# Patient Record
Sex: Female | Born: 2005 | Race: Black or African American | Hispanic: No | Marital: Single | Smoking: Never smoker
Health system: Southern US, Community
[De-identification: ages and names within clinical notes are randomized; demographics above are authoritative.]

## PROBLEM LIST (undated history)

## (undated) ENCOUNTER — Inpatient Hospital Stay (HOSPITAL_COMMUNITY): Payer: Self-pay

## (undated) DIAGNOSIS — Z789 Other specified health status: Secondary | ICD-10-CM

## (undated) HISTORY — DX: Other specified health status: Z78.9

## (undated) HISTORY — PX: WISDOM TOOTH EXTRACTION: SHX21

---
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
## 2024-04-02 ENCOUNTER — Inpatient Hospital Stay (HOSPITAL_COMMUNITY)

## 2024-04-02 ENCOUNTER — Inpatient Hospital Stay (HOSPITAL_COMMUNITY)
Admission: AD | Admit: 2024-04-02 | Discharge: 2024-04-02 | Disposition: A | Discharge status: Home / Self Care | Attending: Obstetrics and Gynecology | Admitting: Obstetrics and Gynecology

## 2024-04-02 ENCOUNTER — Encounter (HOSPITAL_COMMUNITY): Payer: Self-pay | Admitting: Obstetrics and Gynecology

## 2024-04-02 DIAGNOSIS — O26891 Other specified pregnancy related conditions, first trimester: Secondary | ICD-10-CM | POA: Diagnosis present

## 2024-04-02 DIAGNOSIS — Z3201 Encounter for pregnancy test, result positive: Secondary | ICD-10-CM | POA: Diagnosis not present

## 2024-04-02 DIAGNOSIS — Z3A01 Less than 8 weeks gestation of pregnancy: Secondary | ICD-10-CM | POA: Diagnosis not present

## 2024-04-02 DIAGNOSIS — Z3490 Encounter for supervision of normal pregnancy, unspecified, unspecified trimester: Secondary | ICD-10-CM

## 2024-04-02 DIAGNOSIS — O210 Mild hyperemesis gravidarum: Secondary | ICD-10-CM | POA: Diagnosis not present

## 2024-04-02 DIAGNOSIS — R109 Unspecified abdominal pain: Secondary | ICD-10-CM | POA: Insufficient documentation

## 2024-04-02 LAB — URINALYSIS, ROUTINE W REFLEX MICROSCOPIC
Bilirubin Urine: NEGATIVE
Glucose, UA: NEGATIVE mg/dL
Hgb urine dipstick: NEGATIVE
Ketones, ur: NEGATIVE mg/dL
Leukocytes,Ua: NEGATIVE
Nitrite: NEGATIVE
Protein, ur: NEGATIVE mg/dL
Specific Gravity, Urine: 1.002 — ABNORMAL LOW (ref 1.005–1.030)
pH: 6 (ref 5.0–8.0)

## 2024-04-02 LAB — CBC
HCT: 37.5 % (ref 36.0–46.0)
Hemoglobin: 13.2 g/dL (ref 12.0–15.0)
MCH: 30.9 pg (ref 26.0–34.0)
MCHC: 35.2 g/dL (ref 30.0–36.0)
MCV: 87.8 fL (ref 80.0–100.0)
Platelets: 258 K/uL (ref 150–400)
RBC: 4.27 MIL/uL (ref 3.87–5.11)
RDW: 11.6 % (ref 11.5–15.5)
WBC: 8.1 K/uL (ref 4.0–10.5)
nRBC: 0 % (ref 0.0–0.2)

## 2024-04-02 LAB — WET PREP, GENITAL
Clue Cells Wet Prep HPF POC: NONE SEEN
Sperm: NONE SEEN
Trich, Wet Prep: NONE SEEN
WBC, Wet Prep HPF POC: <10 (ref <10)
Yeast Wet Prep HPF POC: NONE SEEN

## 2024-04-02 LAB — HCG, QUANTITATIVE, PREGNANCY: hCG, Beta Chain, Quant, S: 44592 m[IU]/mL — ABNORMAL HIGH (ref <5)

## 2024-04-02 LAB — ABO/RH: ABO/RH(D): B POS

## 2024-04-02 LAB — HIV ANTIBODY (ROUTINE TESTING W REFLEX): HIV Screen 4th Generation wRfx: NONREACTIVE

## 2024-04-02 LAB — POCT PREGNANCY, URINE: Preg Test, Ur: POSITIVE — AB

## 2024-04-02 MED ORDER — PROCHLORPERAZINE MALEATE 10 MG PO TABS
10.0000 mg | ORAL_TABLET | Freq: Four times a day (QID) | ORAL | 0 refills | Status: DC | PRN
Start: 2024-04-02 — End: 2024-05-12

## 2024-04-02 MED ORDER — PREPLUS 27-1 MG PO TABS
1.0000 | ORAL_TABLET | Freq: Every day | ORAL | 13 refills | Status: DC
Start: 2024-04-02 — End: 2024-08-04

## 2024-04-02 NOTE — MAU Provider Note (Signed)
 History     CSN: [id number]  Arrival date and time: 04/02/24 1830   Event Date/Time   First Provider Initiated Contact with Patient 04/02/24 2154      Chief Complaint  Patient presents with   Abdominal Pain   Vaginal Discharge   Possible Pregnancy   HPI [name] is a 18 y.o. year old G41P0000 female at [redacted]w[redacted]d weeks gestation who presents to MAU reporting (+) HPT after being late on her period, feeling tired and nauseated. She also reports a sticky, creamy, yellow vaginal d/c and abdominal cramping like a period. She denies VB. She was not on BC.    OB History     Gravida  1   Para  0   Term  0   Preterm  0   AB  0   Living  0      SAB  0   IAB  0   Ectopic  0   Multiple  0   Live Births  0           History reviewed. No pertinent past medical history.  Past Surgical History:  Procedure Laterality Date   WISDOM TOOTH EXTRACTION      Family History  Problem Relation Age of Onset   Healthy Mother    AAA (abdominal aortic aneurysm) Father    Diabetes Paternal Grandmother    Anemia Paternal Grandfather     Social History   Tobacco Use   Smoking status: Never   Smokeless tobacco: Never  Vaping Use   Vaping status: Never Used  Substance Use Topics   Alcohol use: Never   Drug use: Never    Allergies: No Known Allergies  Medications Prior to Admission  Medication Sig Dispense Refill Last Dose/Taking   ondansetron  (ZOFRAN -ODT) 4 MG disintegrating tablet Take 1 tablet (4 mg total) by mouth every 8 (eight) hours as needed for nausea or vomiting. 20 tablet 0     Review of Systems  Constitutional: Negative.   HENT: Negative.    Eyes: Negative.   Respiratory: Negative.    Cardiovascular: Negative.   Gastrointestinal: Negative.   Endocrine: Negative.   Genitourinary:  Positive for pelvic pain (cramping) and vaginal discharge (sticky, creamy, yellow d/c).  Skin: Negative.   Allergic/Immunologic: Negative.   Neurological: Negative.    Hematological: Negative.   Psychiatric/Behavioral: Negative.     Physical Exam   Blood pressure (!) 124/90, pulse 94, temperature 98.7 F (37.1 C), temperature source Oral, resp. rate 20, height 5' 7 (1.702 m), weight 63 kg, last menstrual period 02/15/2024, SpO2 100%.  Physical Exam Vitals and nursing note reviewed.  Constitutional:      Appearance: Normal appearance. She is normal weight.  Cardiovascular:     Rate and Rhythm: Normal rate.  Pulmonary:     Effort: Pulmonary effort is normal.  Genitourinary:    Comments: Swabs collected by patient using blind swab technique  Musculoskeletal:        General: Normal range of motion.  Skin:    General: Skin is warm and dry.  Neurological:     Mental Status: She is alert and oriented to person, place, and time.  Psychiatric:        Mood and Affect: Mood normal.        Behavior: Behavior normal.        Thought Content: Thought content normal.        Judgment: Judgment normal.    MAU Course  Procedures  MDM  [facility] UPT CBC ABO/Rh HCG Wet Prep GC/CT -- Results pending  RPR -- Results pending  OB U/S < 14 wks TVUS  Results for orders placed or performed during the hospital encounter of 04/02/24 (from the past 24 hours)  Pregnancy, urine POC     Status: Abnormal   Collection Time: 04/02/24  8:21 PM  Result Value Ref Range   Preg Test, Ur POSITIVE (A) NEGATIVE  Wet prep, genital     Status: None   Collection Time: 04/02/24  8:23 PM  Result Value Ref Range   Yeast Wet Prep HPF POC NONE SEEN NONE SEEN   Trich, Wet Prep NONE SEEN NONE SEEN   Clue Cells Wet Prep HPF POC NONE SEEN NONE SEEN   WBC, Wet Prep HPF POC <10 <10   Sperm NONE SEEN   Urinalysis, Routine w reflex microscopic -Urine, Clean Catch     Status: Abnormal   Collection Time: 04/02/24  8:24 PM  Result Value Ref Range   Color, Urine STRAW (A) YELLOW   APPearance HAZY (A) CLEAR   Specific Gravity, Urine 1.002 (L) 1.005 - 1.030   pH 6.0 5.0 - 8.0    Glucose, UA NEGATIVE NEGATIVE mg/dL   Hgb urine dipstick NEGATIVE NEGATIVE   Bilirubin Urine NEGATIVE NEGATIVE   Ketones, ur NEGATIVE NEGATIVE mg/dL   Protein, ur NEGATIVE NEGATIVE mg/dL   Nitrite NEGATIVE NEGATIVE   Leukocytes,Ua NEGATIVE NEGATIVE  ABO/Rh     Status: None   Collection Time: 04/02/24  8:52 PM  Result Value Ref Range   ABO/RH(D) B POS    No rh immune globuloin      NOT A RH IMMUNE GLOBULIN CANDIDATE, PT RH POSITIVE Performed at [facility], [address] [place], [place] [address]   CBC     Status: None   Collection Time: 04/02/24  8:58 PM  Result Value Ref Range   WBC 8.1 4.0 - 10.5 K/uL   RBC 4.27 3.87 - 5.11 MIL/uL   Hemoglobin 13.2 12.0 - 15.0 g/dL   HCT 62.4 63.9 - 53.9 %   MCV 87.8 80.0 - 100.0 fL   MCH 30.9 26.0 - 34.0 pg   MCHC 35.2 30.0 - 36.0 g/dL   RDW 88.3 88.4 - 84.4 %   Platelets 258 150 - 400 K/uL   nRBC 0.0 0.0 - 0.2 %    US  OB LESS THAN 14 WEEKS WITH OB TRANSVAGINAL Result Date: 04/02/2024 CLINICAL DATA:  Abdominal pain during first trimester pregnancy. Estimated gestational age by LMP is 6 weeks 5 days. Positive urine pregnancy test. Quantitative beta HCG is pending. EXAM: OBSTETRIC <14 WK US  AND TRANSVAGINAL OB US  TECHNIQUE: Transvaginal ultrasound was performed for complete evaluation of the gestation as well as the maternal uterus, adnexal regions, and pelvic cul-de-sac. COMPARISON:  None Available. FINDINGS: Intrauterine gestational sac: A single intrauterine gestational sac is present. Yolk sac:  Yolk sac is present. Embryo:  Fetal pole is present. Cardiac Activity: Fetal cardiac activity is observed. Heart Rate: 133 bpm CRL:   6.9 mm   6 w 4 d                  US  EDC: 11/22/2024 Subchorionic hemorrhage:  None visualized. Maternal uterus/adnexae: Uterus appears retroverted. No myometrial mass lesions identified. Both ovaries are visualized and appear normal. Trace free fluid. IMPRESSION: Single intrauterine pregnancy. Estimated gestational  age by crown-rump length is 6 weeks 4 days. No acute complication demonstrated sonographically. Electronically Signed  By: Elsie Gravely M.D.   On: 04/02/2024 21:42     Assessment and Plan  1. Intrauterine pregnancy (Primary) - Information provided on [place] [facility] Providers list - Advised to start [facility] as soon as she could get scheduled   2. Morning sickness - Information provided on morning sickness  - Prescription for: Compazine  10 mg po every 6 hrs prn N/V  3. [redacted] weeks gestation of pregnancy - Information provided on safe medications in pregnancy list     - Discharge home - Call to schedule [facility] - Patient verbalized an understanding of the plan of care and agrees.    Ala Cart, CNM 04/02/2024, 9:54 PM

## 2024-04-02 NOTE — MAU Note (Signed)
 MAU Triage Note:  .Karen Koch is a 18 y.o. at Unknown here in MAU reporting: had a +HPT today after being late on her period and feeling tired/nauseous. Denies VB. Does report sticky/creamy yellow discharge. Also reporting cramping that feels like period cramps. Was not on any BC.  Patient complaint: preg confirm  Pain Score: 1  Pain Location: Abdomen     Onset of complaint: today LMP: Patient's last menstrual period was 02/15/2024.  Vitals:   04/02/24 2014  BP: (!) 124/90  Pulse: 94  Resp: 20  Temp: 98.7 F (37.1 C)  SpO2: 100%    Lab orders placed from triage: orders in prior to triage

## 2024-04-02 NOTE — Discharge Instructions (Addendum)

## 2024-04-03 LAB — RPR: RPR Ser Ql: NONREACTIVE

## 2024-04-03 LAB — GC/CHLAMYDIA PROBE AMP (~~LOC~~) NOT AT ARMC
Chlamydia: NEGATIVE
Comment: NEGATIVE
Comment: NORMAL
Neisseria Gonorrhea: NEGATIVE

## 2024-04-14 ENCOUNTER — Ambulatory Visit: Payer: Self-pay | Admitting: *Deleted

## 2024-04-14 ENCOUNTER — Other Ambulatory Visit (INDEPENDENT_AMBULATORY_CARE_PROVIDER_SITE_OTHER): Payer: Self-pay

## 2024-04-14 VITALS — BP 114/70 | HR 79 | Wt 138.8 lb

## 2024-04-14 DIAGNOSIS — Z3401 Encounter for supervision of normal first pregnancy, first trimester: Secondary | ICD-10-CM | POA: Diagnosis not present

## 2024-04-14 DIAGNOSIS — O219 Vomiting of pregnancy, unspecified: Secondary | ICD-10-CM

## 2024-04-14 DIAGNOSIS — Z362 Encounter for other antenatal screening follow-up: Secondary | ICD-10-CM

## 2024-04-14 DIAGNOSIS — Z3A01 Less than 8 weeks gestation of pregnancy: Secondary | ICD-10-CM

## 2024-04-14 DIAGNOSIS — Z3480 Encounter for supervision of other normal pregnancy, unspecified trimester: Secondary | ICD-10-CM

## 2024-04-14 DIAGNOSIS — Z3A08 8 weeks gestation of pregnancy: Secondary | ICD-10-CM

## 2024-04-14 DIAGNOSIS — Z1331 Encounter for screening for depression: Secondary | ICD-10-CM | POA: Diagnosis not present

## 2024-04-14 MED ORDER — DOXYLAMINE-PYRIDOXINE 10-10 MG PO TBEC: 2.0000 | DELAYED_RELEASE_TABLET | Freq: Every day | ORAL | 5 refills | Status: DC

## 2024-04-14 MED ORDER — VITAFOL GUMMIES 3.33-0.333-34.8 MG PO CHEW: 3.0000 | CHEWABLE_TABLET | Freq: Every day | ORAL | 11 refills | Status: DC

## 2024-04-14 MED ORDER — VITAFOL GUMMIES 3.33-0.333-34.8 MG PO CHEW
1.0000 | CHEWABLE_TABLET | Freq: Every day | ORAL | 5 refills | Status: DC
Start: 2024-04-14 — End: 2024-04-14

## 2024-04-14 NOTE — Progress Notes (Signed)
 New OB Intake  I connected with Karen Koch  on 04/14/24 at  3:10 PM EDT by In Person Visit and verified that I am speaking with the correct person using two identifiers. Nurse is located at [facility] and pt is located at [place].  I discussed the limitations, risks, security and privacy concerns of performing an evaluation and management service by telephone and the availability of in person appointments. I also discussed with the patient that there may be a patient responsible charge related to this service. The patient expressed understanding and agreed to proceed.  I explained I am completing New OB Intake today. We discussed EDD of 11/21/24 based on LMP of 02/15/24. Pt is G1P0000. I reviewed her allergies, medications and Medical/Surgical/OB history.    Patient Active Problem List   Diagnosis Date Noted   Supervision of other normal pregnancy, antepartum 04/14/2024   Birth control counseling 12/07/2020   Encounter for BCP (birth control pills) initial prescription 12/07/2020   Dysmenorrhea in adolescent 12/07/2020     Concerns addressed today  Delivery Plans Plans to deliver at [facility]. Discussed the nature of our practice with multiple providers including residents and students. Due to the size of the practice, the delivering provider may not be the same as those providing prenatal care.   Patient is not interested in water birth.  MyChart/Babyscripts MyChart access verified. I explained pt will have some visits in office and some virtually. Babyscripts instructions given and order placed. Patient verifies receipt of registration text/e-mail. Account successfully created and app downloaded. If patient is a candidate for Optimized scheduling, add to sticky note.   Blood Pressure Cuff/Weight Scale Pt has BP cuff at home. Explained after first prenatal appt pt will check weekly and document in Babyscripts. Patient does not have weight scale; patient may purchase if they desire to track  weight weekly in Babyscripts.  Anatomy US  Explained first scheduled US  will be around 19 weeks. Anatomy US  scheduled for TBD at TBD.  Is patient a candidate for Babyscripts Optimization? No, due to Teen   First visit review I reviewed new OB appt with patient. Explained pt will be seen by Karen Koch,CNM at first visit. Discussed Karen Koch genetic screening with patient. Requests [employer] and [employer]. Routine prenatal labs OB Urine only collected at today's visit. Initial OB labs deferred to New OB appt.   Last Pap No results found for: DIAGPAP  Karen CHRISTELLA Ober, RN 04/14/2024  3:52 PM

## 2024-04-14 NOTE — Patient Instructions (Signed)

## 2024-04-16 ENCOUNTER — Other Ambulatory Visit: Payer: Self-pay

## 2024-04-16 MED ORDER — PRENATAL 28-0.8 MG PO TABS: 1.0000 | ORAL_TABLET | Freq: Every day | ORAL | 12 refills | Status: DC

## 2024-04-20 LAB — URINE CULTURE, OB REFLEX

## 2024-04-20 LAB — CULTURE, OB URINE

## 2024-04-22 ENCOUNTER — Ambulatory Visit: Payer: Self-pay | Admitting: Obstetrics and Gynecology

## 2024-04-22 MED ORDER — CEPHALEXIN 500 MG PO CAPS: 500.0000 mg | ORAL_CAPSULE | Freq: Four times a day (QID) | ORAL | 2 refills | Status: DC

## 2024-04-30 ENCOUNTER — Encounter: Payer: Self-pay | Admitting: Advanced Practice Midwife

## 2024-05-02 ENCOUNTER — Other Ambulatory Visit: Payer: Self-pay

## 2024-05-02 MED ORDER — GLYCOPYRROLATE 2 MG PO TABS: 2.0000 mg | ORAL_TABLET | Freq: Two times a day (BID) | ORAL | 1 refills | Status: DC | PRN

## 2024-05-12 ENCOUNTER — Encounter: Payer: Self-pay | Admitting: Advanced Practice Midwife

## 2024-05-12 ENCOUNTER — Ambulatory Visit (INDEPENDENT_AMBULATORY_CARE_PROVIDER_SITE_OTHER): Payer: Self-pay | Admitting: Advanced Practice Midwife

## 2024-05-12 VITALS — BP 112/71 | HR 90 | Wt 134.8 lb

## 2024-05-12 DIAGNOSIS — Z1331 Encounter for screening for depression: Secondary | ICD-10-CM

## 2024-05-12 DIAGNOSIS — O99611 Diseases of the digestive system complicating pregnancy, first trimester: Secondary | ICD-10-CM | POA: Diagnosis not present

## 2024-05-12 DIAGNOSIS — Z3A12 12 weeks gestation of pregnancy: Secondary | ICD-10-CM

## 2024-05-12 DIAGNOSIS — Z3480 Encounter for supervision of other normal pregnancy, unspecified trimester: Secondary | ICD-10-CM

## 2024-05-12 DIAGNOSIS — O219 Vomiting of pregnancy, unspecified: Secondary | ICD-10-CM | POA: Diagnosis not present

## 2024-05-12 DIAGNOSIS — N390 Urinary tract infection, site not specified: Secondary | ICD-10-CM

## 2024-05-12 DIAGNOSIS — O99619 Diseases of the digestive system complicating pregnancy, unspecified trimester: Secondary | ICD-10-CM | POA: Insufficient documentation

## 2024-05-12 DIAGNOSIS — K117 Disturbances of salivary secretion: Secondary | ICD-10-CM

## 2024-05-12 DIAGNOSIS — B9689 Other specified bacterial agents as the cause of diseases classified elsewhere: Secondary | ICD-10-CM

## 2024-05-12 MED ORDER — CEFADROXIL 500 MG PO CAPS: 500.0000 mg | ORAL_CAPSULE | Freq: Two times a day (BID) | ORAL | 0 refills | Status: DC

## 2024-05-12 MED ORDER — GLYCOPYRROLATE 2 MG PO TABS
2.0000 mg | ORAL_TABLET | Freq: Three times a day (TID) | ORAL | 1 refills | Status: DC | PRN
Start: 2024-05-12 — End: 2024-08-04

## 2024-05-12 MED ORDER — ASPIRIN 81 MG PO TBEC: 81.0000 mg | DELAYED_RELEASE_TABLET | Freq: Every day | ORAL | 6 refills | Status: DC

## 2024-05-12 MED ORDER — ONDANSETRON 4 MG PO TBDP
4.0000 mg | ORAL_TABLET | Freq: Three times a day (TID) | ORAL | 0 refills | Status: DC | PRN
Start: 2024-05-12 — End: 2024-07-07

## 2024-05-12 NOTE — Progress Notes (Signed)
 Pt states she had brownish discharge a month ago that lasted about a week.   Pt scored 10 on PHQ and 8 on GAD. Referral made, pt aware.

## 2024-05-12 NOTE — Progress Notes (Addendum)
 Subjective:   Karen Koch is a 18 y.o. G1P0000 at [redacted]w[redacted]d by LMP being seen today for her first obstetrical visit. She has no previous pregnancies and has Supervision of other normal pregnancy, antepartum and UTI due to Klebsiella species on their problem list. Patient does intend to breast feed.   Patient reports daily nausea and vomiting with excessive saliva production. Has been able to hydrate and eat small amounts of bland food. N/V is interfering with ability to do her job.  HISTORY: OB History  Gravida Para Term Preterm AB Living  1 0 0 0 0 0  SAB IAB Ectopic Multiple Live Births  0 0 0 0 0    # Outcome Date GA Lbr Len/2nd Weight Sex Type Anes PTL Lv  1 Current            Past Medical History:  Diagnosis Date   Dysmenorrhea in adolescent 12/07/2020   Medical history non-contributory    Past Surgical History:  Procedure Laterality Date   WISDOM TOOTH EXTRACTION     Family History  Problem Relation Age of Onset   Seizures Mother    AAA (abdominal aortic aneurysm) Father    Diabetes Paternal Grandmother    Anemia Paternal Grandfather    Social History   Tobacco Use   Smoking status: Never   Smokeless tobacco: Never  Vaping Use   Vaping status: Never Used  Substance Use Topics   Alcohol use: Never   Drug use: Never   No Known Allergies Current Outpatient Medications on File Prior to Visit  Medication Sig Dispense Refill   cephALEXin  (KEFLEX ) 500 MG capsule Take 1 capsule (500 mg total) by mouth 4 (four) times daily. 28 capsule 2   Doxylamine -Pyridoxine  (DICLEGIS ) 10-10 MG TBEC Take 2 tablets by mouth at bedtime. If symptoms persist, add one tablet in the morning and one in the afternoon 100 tablet 5   Prenatal Vit-Fe Fumarate-FA (PREPLUS) 27-1 MG TABS Take 1 tablet by mouth daily. 30 tablet 13   Prenatal 28-0.8 MG TABS Take 1 tablet by mouth daily. (Patient not taking: Reported on 05/12/2024) 30 tablet 12   prochlorperazine  (COMPAZINE ) 10 MG tablet Take 1  tablet (10 mg total) by mouth every 6 (six) hours as needed for nausea or vomiting. (Patient not taking: Reported on 05/12/2024) 120 tablet 0   No current facility-administered medications on file prior to visit.    Exam   Vitals:   05/12/24 1507  BP: 112/71  Pulse: 90  Weight: 61.1 kg   Fetal Heart Rate (bpm): 146  VS reviewed, nursing note reviewed,  Constitutional: well developed, well nourished, no distress HEENT: normocephalic CV: normal rate Pulm/chest wall: normal effort Abdomen: soft Neuro: alert and oriented x 3 Skin: warm, dry Psych: affect normal   Assessment:   Pregnancy: G1P0000 Patient Active Problem List   Diagnosis Date Noted   UTI due to Klebsiella species 05/12/2024   Supervision of other normal pregnancy, antepartum 04/14/2024     Plan:   1. Supervision of other normal pregnancy, antepartum (Primary) Discussed indications for aspirin  therapy and prescription sent - aspirin  EC 81 MG tablet; Take 1 tablet (81 mg total) by mouth daily. Swallow whole.  Dispense: 30 tablet; Refill: 6  Unable to have labs drawn today due to timing, will schedule lab visit - CBC/D/Plt+RPR+Rh+ABO+RubIgG... - HgB A1c - PANORAMA PRENATAL TEST - HORIZON Basic Panel   2. [redacted] weeks gestation of pregnancy   3. Nausea and vomiting during pregnancy  Unable to take Compazine  due to drowsiness, currently taking Diclegis  1 tablet at bedtime, discussed increasing to 2 tablets at bedtime with tablet at breakfast and lunch, will add Zofran  PRN - ondansetron  (ZOFRAN -ODT) 4 MG disintegrating tablet; Take 1 tablet (4 mg total) by mouth every 8 (eight) hours as needed for nausea or vomiting.  Dispense: 20 tablet; Refill: 0  4. Excessive salivation while pregnant Trial Robinul  - glycopyrrolate  (ROBINUL ) 2 MG tablet; Take 1 tablet (2 mg total) by mouth 3 (three) times daily as needed.  Dispense: 30 tablet; Refill: 1  5. Positive depression screening PHQ-9 score= 10, patient is open to  mental health support, schedule during checkout - Ambulatory referral to Integrated Behavioral Health  6. UTI due to Klebsiella species Was able to take 3 tablets of Keflex , but had difficulty taking pills due to N/V, ongoing abnormal urine odor and feeling of incomplete emptying, will treat with Duricef, check TOC in 4 weeks - cefadroxil  (DURICEF) 500 MG capsule; Take 1 capsule (500 mg total) by mouth 2 (two) times daily.  Dispense: 14 capsule; Refill: 0    Return for initial labs. Continue prenatal vitamins. Discussed and offered genetic screening options, including Quad screen/AFP, NIPS testing, and option to decline testing. Benefits/risks/alternatives reviewed. Pt aware that anatomy US  is form of genetic screening with lower accuracy in detecting trisomies than blood work.  Pt chooses NIPS.  Ultrasound discussed; fetal anatomic survey: ordered. Problem list reviewed and updated. The nature of [facility] with multiple MDs and other Advanced Practice Providers was explained to patient; also emphasized that residents, students are part of our team. Routine obstetric precautions reviewed. Return in about 4 weeks (around 06/09/2024) for LOB.   Vernell FORBES Ruddle, Student-MidWife 05/12/24 6:12 PM  Midwife Attestation:  I personally saw and evaluated the patient, performing the key elements of the service. I developed and verified the management plan that is described in the resident's/student's note, and I agree with the content with my edits above. VSS, HRR&R, Resp unlabored, Legs neg.    Olam Boards, CNM 7:43 PM

## 2024-05-12 NOTE — Patient Instructions (Addendum)
 Baby aspirin  and prenatal every day!  UTI  Duricef at breakfast and at bedtime  Nausea Diclegis  2 tablets at bedtime, 1 tablet at breakfast, 1 tablet at lunch Robinul  1 tablet at breakfast, 1 tablet afternoon, 1 tablet at bedtime Zofran  every 8 hours as needed

## 2024-05-15 ENCOUNTER — Other Ambulatory Visit: Payer: Self-pay

## 2024-05-16 LAB — CBC/D/PLT+RPR+RH+ABO+RUBIGG...
Antibody Screen: NEGATIVE
Basophils Absolute: 0 x10E3/uL (ref 0.0–0.2)
Basos: 0 %
EOS (ABSOLUTE): 0.1 x10E3/uL (ref 0.0–0.4)
Eos: 1 %
HCV Ab: NONREACTIVE
HIV Screen 4th Generation wRfx: NONREACTIVE
Hematocrit: 40.2 % (ref 34.0–46.6)
Hemoglobin: 13.4 g/dL (ref 11.1–15.9)
Hepatitis B Surface Ag: NEGATIVE
Immature Grans (Abs): 0 x10E3/uL (ref 0.0–0.1)
Immature Granulocytes: 0 %
Lymphocytes Absolute: 2.2 x10E3/uL (ref 0.7–3.1)
Lymphs: 28 %
MCH: 30.9 pg (ref 26.6–33.0)
MCHC: 33.3 g/dL (ref 31.5–35.7)
MCV: 93 fL (ref 79–97)
Monocytes Absolute: 0.6 x10E3/uL (ref 0.1–0.9)
Monocytes: 8 %
Neutrophils Absolute: 4.9 x10E3/uL (ref 1.4–7.0)
Neutrophils: 63 %
Platelets: 253 x10E3/uL (ref 150–450)
RBC: 4.34 x10E6/uL (ref 3.77–5.28)
RDW: 13 % (ref 11.7–15.4)
RPR Ser Ql: NONREACTIVE
Rh Factor: POSITIVE
Rubella Antibodies, IGG: 15.8 {index} (ref >0.99)
WBC: 7.7 x10E3/uL (ref 3.4–10.8)

## 2024-05-16 LAB — HEMOGLOBIN A1C
Est. average glucose Bld gHb Est-mCnc: 94 mg/dL
Hgb A1c MFr Bld: 4.9 % (ref 4.8–5.6)

## 2024-05-16 LAB — HCV INTERPRETATION

## 2024-05-19 ENCOUNTER — Ambulatory Visit: Payer: Self-pay | Admitting: Licensed Clinical Social Worker

## 2024-05-19 ENCOUNTER — Other Ambulatory Visit: Payer: Self-pay

## 2024-05-19 MED ORDER — PRENATAL 28-0.8 MG PO TABS: 1.0000 | ORAL_TABLET | Freq: Every day | ORAL | 12 refills | Status: AC

## 2024-05-19 NOTE — BH Specialist Note (Signed)
 Patient no showed for today's visit.

## 2024-05-21 LAB — PANORAMA PRENATAL TEST FULL PANEL:PANORAMA TEST PLUS 5 ADDITIONAL MICRODELETIONS: FETAL FRACTION: 7.5

## 2024-05-22 LAB — HORIZON CUSTOM: REPORT SUMMARY: NEGATIVE

## 2024-06-09 ENCOUNTER — Ambulatory Visit (INDEPENDENT_AMBULATORY_CARE_PROVIDER_SITE_OTHER): Payer: Self-pay | Admitting: Obstetrics and Gynecology

## 2024-06-09 ENCOUNTER — Encounter: Payer: Self-pay | Admitting: Obstetrics and Gynecology

## 2024-06-09 VITALS — BP 118/72 | HR 94 | Wt 135.5 lb

## 2024-06-09 DIAGNOSIS — Z3A16 16 weeks gestation of pregnancy: Secondary | ICD-10-CM

## 2024-06-09 DIAGNOSIS — Z3480 Encounter for supervision of other normal pregnancy, unspecified trimester: Secondary | ICD-10-CM

## 2024-06-09 NOTE — Progress Notes (Signed)
 Pt presents for rob. Pt has no questions or concerns at this time.

## 2024-06-09 NOTE — Progress Notes (Signed)
   PRENATAL VISIT NOTE  Subjective:  Karen Koch is a 18 y.o. G1P0000 at [redacted]w[redacted]d being seen today for ongoing prenatal care.  She is currently monitored for the following issues for this low-risk pregnancy and has Supervision of other normal pregnancy, antepartum; UTI due to Klebsiella species; Excessive salivation while pregnant; Nausea and vomiting during pregnancy; and Positive depression screening on their problem list.  Patient reports no complaints.  Contractions: Not present. Vag. Bleeding: None.  Movement: Present. Denies leaking of fluid.   The following portions of the patient's history were reviewed and updated as appropriate: allergies, current medications, past family history, past medical history, past social history, past surgical history and problem list.   Objective:    Vitals:   06/09/24 1436  BP: 118/72  Pulse: 94  Weight: 135 lb 8 oz (61.5 kg)    Fetal Status:  Fetal Heart Rate (bpm): 152   Movement: Present    General: Alert, oriented and cooperative. Patient is in no acute distress.  Skin: Skin is warm and dry. No rash noted.   Cardiovascular: Normal heart rate noted  Respiratory: Normal respiratory effort, no problems with respiration noted  Abdomen: Soft, gravid, appropriate for gestational age.  Pain/Pressure: Present     Pelvic: Cervical exam deferred        Extremities: Normal range of motion.  Edema: None  Mental Status: Normal mood and affect. Normal behavior. Normal judgment and thought content.   Assessment and Plan:  Pregnancy: G1P0000 at [redacted]w[redacted]d 1. Supervision of other normal pregnancy, antepartum (Primary) Patient is doing well without complaints She reports improvement in nausea and emesis Follow up anatomy 10/14 AFP today TOC urine culture today  Preterm labor symptoms and general obstetric precautions including but not limited to vaginal bleeding, contractions, leaking of fluid and fetal movement were reviewed in detail with the  patient. Please refer to After Visit Summary for other counseling recommendations.   Return in about 4 weeks (around 07/07/2024) for in person, ROB, Low risk.  Future Appointments  Date Time Provider Department Center  07/08/2024  8:00 AM [facility] PROVIDER 1 [facility]  07/08/2024  8:30 AM [facility] US5 [facility]    Winton Felt, MD

## 2024-06-11 LAB — AFP, SERUM, OPEN SPINA BIFIDA
AFP MoM: 2.75
AFP Value: 107.5 ng/mL
Gest. Age on Collection Date: 16 wk
Maternal Age At EDD: 19.5 a
OSBR Risk 1 IN: 363
Test Results:: NEGATIVE
Weight: 135 [lb_av]

## 2024-06-11 LAB — URINE CULTURE, OB REFLEX

## 2024-06-11 LAB — CULTURE, OB URINE

## 2024-06-25 DIAGNOSIS — O09899 Supervision of other high risk pregnancies, unspecified trimester: Secondary | ICD-10-CM | POA: Insufficient documentation

## 2024-07-07 ENCOUNTER — Other Ambulatory Visit (HOSPITAL_COMMUNITY)
Admission: RE | Admit: 2024-07-07 | Discharge: 2024-07-07 | Disposition: A | Discharge status: Home / Self Care | Source: Ambulatory Visit | Attending: Physician Assistant | Admitting: Physician Assistant

## 2024-07-07 ENCOUNTER — Encounter: Payer: Self-pay | Admitting: Physician Assistant

## 2024-07-07 ENCOUNTER — Ambulatory Visit: Admitting: Physician Assistant

## 2024-07-07 VITALS — BP 117/71 | HR 91 | Wt 140.8 lb

## 2024-07-07 DIAGNOSIS — M546 Pain in thoracic spine: Secondary | ICD-10-CM

## 2024-07-07 DIAGNOSIS — Z3482 Encounter for supervision of other normal pregnancy, second trimester: Secondary | ICD-10-CM | POA: Diagnosis present

## 2024-07-07 DIAGNOSIS — Z3A20 20 weeks gestation of pregnancy: Secondary | ICD-10-CM | POA: Insufficient documentation

## 2024-07-07 DIAGNOSIS — O219 Vomiting of pregnancy, unspecified: Secondary | ICD-10-CM

## 2024-07-07 DIAGNOSIS — Z3480 Encounter for supervision of other normal pregnancy, unspecified trimester: Secondary | ICD-10-CM

## 2024-07-07 MED ORDER — ONDANSETRON 4 MG PO TBDP: 4.0000 mg | ORAL_TABLET | Freq: Four times a day (QID) | ORAL | 2 refills | Status: DC | PRN

## 2024-07-07 NOTE — Progress Notes (Signed)
   PRENATAL VISIT NOTE  Subjective:  Karen Koch is a 18 y.o. G1P0000 at [redacted]w[redacted]d being seen today for ongoing prenatal care.  She is currently monitored for the following issues for this low-risk pregnancy and has Supervision of other normal pregnancy, antepartum; UTI due to Klebsiella species; Excessive salivation while pregnant; Nausea and vomiting during pregnancy; Positive depression screening; and High risk teen pregnancy on their problem list.  Patient reports large volume creamy discharge over past month. Mild cramping/contractions. No vaginal itching, malodor, irritation, burning.    Contractions: Irritability. Vag. Bleeding: None.  Movement: Increased. Denies leaking of fluid.   The following portions of the patient's history were reviewed and updated as appropriate: allergies, current medications, past family history, past medical history, past social history, past surgical history and problem list.   Objective:    Vitals:   07/07/24 1008  BP: 117/71  Pulse: 91  Weight: 140 lb 12.8 oz (63.9 kg)    Fetal Status:  Fetal Heart Rate (bpm): 147 Fundal Height: 21 cm Movement: Increased    General: Alert, oriented and cooperative. Patient is in no acute distress.  Skin: Skin is warm and dry. No rash noted.   Cardiovascular: Normal heart rate noted  Respiratory: Normal respiratory effort, no problems with respiration noted  Abdomen: Soft, gravid, appropriate for gestational age.  Pain/Pressure: Present     Pelvic: Cervical exam deferred        Extremities: Normal range of motion.  Edema: None  Mental Status: Normal mood and affect. Normal behavior. Normal judgment and thought content.   Assessment and Plan:  Pregnancy: G1P0000 at [redacted]w[redacted]d  1. Supervision of other normal pregnancy, antepartum (Primary) Patient doing well, feeling regular fetal movement BP, FHR, FH appropriate   2. [redacted] weeks gestation of pregnancy Anticipatory guidance about next visits/weeks of pregnancy given.    3. Nausea and vomiting during pregnancy - ondansetron  (ZOFRAN -ODT) 4 MG disintegrating tablet; Take 1 tablet (4 mg total) by mouth every 6 (six) hours as needed for nausea.  Dispense: 30 tablet; Refill: 2   4. Thoracic back pain, unspecified back pain laterality, unspecified chronicity - Ambulatory referral to Physical Therapy   Preterm labor symptoms and general obstetric precautions including but not limited to vaginal bleeding, contractions, leaking of fluid and fetal movement were reviewed in detail with the patient.  Please refer to After Visit Summary for other counseling recommendations.   Return in about 4 weeks (around 08/04/2024) for LOB.  Future Appointments  Date Time Provider Department Center  07/08/2024  8:00 AM [facility] PROVIDER 1 [facility]  07/08/2024  8:30 AM [facility] US5 [facility]    Jorene FORBES Moats, PA-C

## 2024-07-07 NOTE — Progress Notes (Signed)
 Lower back pain (scale of 7/10). Pt has concerns about sleeping position and would like to discuss with provider.   Watery dc for approx one month according to pt. No odor. Pt wears panty liners for dc.   Pt denies headaches, dizziness, and vision changes.   Aspirin  caused vomiting. Pt stopped taking.   Pt needs refill of zofran .   Pt has questions about circumcision.

## 2024-07-07 NOTE — Addendum Note (Signed)
 Addended by: ALVIA ROSINA GAILS on: 07/07/2024 04:29 PM   Modules accepted: Orders

## 2024-07-08 ENCOUNTER — Other Ambulatory Visit: Payer: Self-pay | Admitting: *Deleted

## 2024-07-08 ENCOUNTER — Ambulatory Visit: Admitting: Obstetrics and Gynecology

## 2024-07-08 ENCOUNTER — Ambulatory Visit: Attending: Obstetrics and Gynecology

## 2024-07-08 VITALS — BP 120/65

## 2024-07-08 DIAGNOSIS — O09892 Supervision of other high risk pregnancies, second trimester: Secondary | ICD-10-CM

## 2024-07-08 DIAGNOSIS — Z36 Encounter for antenatal screening for chromosomal anomalies: Secondary | ICD-10-CM | POA: Diagnosis not present

## 2024-07-08 DIAGNOSIS — Z3A20 20 weeks gestation of pregnancy: Secondary | ICD-10-CM | POA: Insufficient documentation

## 2024-07-08 DIAGNOSIS — O358XX0 Maternal care for other (suspected) fetal abnormality and damage, not applicable or unspecified: Secondary | ICD-10-CM

## 2024-07-08 DIAGNOSIS — Z3480 Encounter for supervision of other normal pregnancy, unspecified trimester: Secondary | ICD-10-CM | POA: Diagnosis not present

## 2024-07-08 DIAGNOSIS — Z556 Problems related to health literacy: Secondary | ICD-10-CM | POA: Insufficient documentation

## 2024-07-08 LAB — CERVICOVAGINAL ANCILLARY ONLY
Bacterial Vaginitis (gardnerella): NEGATIVE
Candida Glabrata: NEGATIVE
Candida Vaginitis: NEGATIVE
Chlamydia: NEGATIVE
Comment: NEGATIVE
Comment: NEGATIVE
Comment: NEGATIVE
Comment: NEGATIVE
Comment: NEGATIVE
Comment: NORMAL
Neisseria Gonorrhea: NEGATIVE
Trichomonas: NEGATIVE

## 2024-07-08 NOTE — Progress Notes (Signed)
 Maternal-Fetal Medicine Consultation  Name: Karen Koch  MRN: [id number]  GA: G1P0000 [redacted]w[redacted]d   Teen pregnancy.  Patient is here for fetal anatomy scan. On cell-free fetal DNA screening, the risks of aneuploidies are not increased. MSAFP screening showed low risk for open-neural tube defects.  Her pregnancy is dated by sure LMP date consistent with 6-week ultrasound.  Ultrasound Fetal biometry is consistent with her previously-established dates. Normal amniotic fluid.  No makers of aneuploidies or fetal structural defects are seen.  Patient understands the limitations of ultrasound in detecting fetal anomalies.   Teen pregnancy Teen pregnancy is associated with increased risk of adverse outcomes including preeclampsia, fetal growth restriction, preterm birth and increased to perinatal mortality.  Substance use disorder and mental health problems have been reported to be associated with teen pregnancy. I counseled the patient on teen pregnancy and recommended a fetal growth assessment at [redacted] weeks gestation.  Patient has no other risk factors other than first pregnancy for gestational hypertension/preeclampsia.  I encouraged the patient to take prenatal vitamins regularly.  Recommendations -An appointment was made for her to return in 5 weeks for completion of fetal anatomy. - Fetal growth assessment at [redacted] weeks gestation.     Consultation including face-to-face (more than 50%) counseling 30 minutes.

## 2024-07-12 ENCOUNTER — Ambulatory Visit: Payer: Self-pay | Admitting: Physician Assistant

## 2024-08-04 ENCOUNTER — Ambulatory Visit (INDEPENDENT_AMBULATORY_CARE_PROVIDER_SITE_OTHER): Admitting: Obstetrics and Gynecology

## 2024-08-04 VITALS — BP 115/74 | Wt 143.0 lb

## 2024-08-04 DIAGNOSIS — Z3480 Encounter for supervision of other normal pregnancy, unspecified trimester: Secondary | ICD-10-CM

## 2024-08-04 DIAGNOSIS — Z3A24 24 weeks gestation of pregnancy: Secondary | ICD-10-CM | POA: Diagnosis not present

## 2024-08-04 DIAGNOSIS — O219 Vomiting of pregnancy, unspecified: Secondary | ICD-10-CM

## 2024-08-04 DIAGNOSIS — O09892 Supervision of other high risk pregnancies, second trimester: Secondary | ICD-10-CM | POA: Diagnosis not present

## 2024-08-04 DIAGNOSIS — O99619 Diseases of the digestive system complicating pregnancy, unspecified trimester: Secondary | ICD-10-CM

## 2024-08-04 DIAGNOSIS — K117 Disturbances of salivary secretion: Secondary | ICD-10-CM

## 2024-08-04 MED ORDER — ONDANSETRON 4 MG PO TBDP: 4.0000 mg | ORAL_TABLET | Freq: Four times a day (QID) | ORAL | 2 refills | Status: DC | PRN

## 2024-08-04 NOTE — Progress Notes (Signed)
   PRENATAL VISIT NOTE  Subjective:  Karen Koch is a 18 y.o. G1P0000 at [redacted]w[redacted]d being seen today for ongoing prenatal care.  She is currently monitored for the following issues for this low-risk pregnancy and has Supervision of other normal pregnancy, antepartum; UTI due to Klebsiella species; Excessive salivation while pregnant; Nausea and vomiting during pregnancy; Positive depression screening; and High risk teen pregnancy on their problem list.  Patient doing well with no acute concerns today. She reports no complaints.  Contractions: Not present. Vag. Bleeding: None.  Movement: Present. Denies leaking of fluid.   The following portions of the patient's history were reviewed and updated as appropriate: allergies, current medications, past family history, past medical history, past social history, past surgical history and problem list. Problem list updated.  Objective:   Vitals:   08/04/24 0900  BP: 115/74  Weight: 143 lb (64.9 kg)    Fetal Status: Fetal Heart Rate (bpm): 140 Fundal Height: 24 cm Movement: Present     General:  Alert, oriented and cooperative. Patient is in no acute distress.  Skin: Skin is warm and dry. No rash noted.   Cardiovascular: Normal heart rate noted  Respiratory: Normal respiratory effort, no problems with respiration noted  Abdomen: Soft, gravid, appropriate for gestational age.  Pain/Pressure: Absent     Pelvic: Cervical exam deferred        Extremities: Normal range of motion.     Mental Status:  Normal mood and affect. Normal behavior. Normal judgment and thought content.   Assessment and Plan:  Pregnancy: G1P0000 at [redacted]w[redacted]d  1. Nausea and vomiting during pregnancy Pt requested new rx - ondansetron  (ZOFRAN -ODT) 4 MG disintegrating tablet; Take 1 tablet (4 mg total) by mouth every 6 (six) hours as needed for nausea.  Dispense: 30 tablet; Refill: 2  2. [redacted] weeks gestation of pregnancy (Primary)   3. Supervision of other normal pregnancy,  antepartum Continue routine prenatal care US  follow up 11/18  4. High risk teen pregnancy in second trimester   5. Excessive salivation while pregnant No report of issues today  Preterm labor symptoms and general obstetric precautions including but not limited to vaginal bleeding, contractions, leaking of fluid and fetal movement were reviewed in detail with the patient.  Please refer to After Visit Summary for other counseling recommendations.   Return in about 4 weeks (around 09/01/2024) for ROB, in person, 2 hr GTT, 3rd trim labs.   Jerilynn Buddle, MD Faculty Attending Center for [facility]

## 2024-08-12 ENCOUNTER — Ambulatory Visit (HOSPITAL_BASED_OUTPATIENT_CLINIC_OR_DEPARTMENT_OTHER)

## 2024-08-12 ENCOUNTER — Ambulatory Visit: Attending: Obstetrics and Gynecology | Admitting: Obstetrics and Gynecology

## 2024-08-12 ENCOUNTER — Encounter: Payer: Self-pay | Admitting: Obstetrics and Gynecology

## 2024-08-12 DIAGNOSIS — Z3A25 25 weeks gestation of pregnancy: Secondary | ICD-10-CM | POA: Insufficient documentation

## 2024-08-12 DIAGNOSIS — O09892 Supervision of other high risk pregnancies, second trimester: Secondary | ICD-10-CM | POA: Diagnosis not present

## 2024-08-12 DIAGNOSIS — O358XX0 Maternal care for other (suspected) fetal abnormality and damage, not applicable or unspecified: Secondary | ICD-10-CM

## 2024-08-12 DIAGNOSIS — Z362 Encounter for other antenatal screening follow-up: Secondary | ICD-10-CM | POA: Diagnosis not present

## 2024-08-12 DIAGNOSIS — Z3480 Encounter for supervision of other normal pregnancy, unspecified trimester: Secondary | ICD-10-CM

## 2024-08-12 NOTE — Progress Notes (Signed)
 After review, MFM consult with provider is not indicated for today  Arna Ranks, MD 08/12/2024 10:03 AM  [facility]

## 2024-08-12 NOTE — Therapy (Addendum)
 " OUTPATIENT PHYSICAL THERAPY THORACOLUMBAR EVALUATION/ DISCHARGE NOTE   Patient Name: Karen Koch MRN: [id number] DOB:2005/12/15, 18 y.o., female Today's Date: 08/13/2024  END OF SESSION:  PT End of Session - 08/13/24 0932     Visit Number 1    Date for Recertification  10/08/24    Authorization Type [employer]/ [employer] (auth requested)    PT Start Time 0801    PT Stop Time 0831    PT Time Calculation (min) 30 min    Activity Tolerance Patient tolerated treatment well    Behavior During Therapy [facility] for tasks assessed/performed          Past Medical History:  Diagnosis Date   Dysmenorrhea in adolescent 12/07/2020   Medical history non-contributory    Past Surgical History:  Procedure Laterality Date   WISDOM TOOTH EXTRACTION     Patient Active Problem List   Diagnosis Date Noted   High risk teen pregnancy 06/25/2024   UTI due to Klebsiella species 05/12/2024   Excessive salivation while pregnant 05/12/2024   Nausea and vomiting during pregnancy 05/12/2024   Positive depression screening 05/12/2024   Supervision of other normal pregnancy, antepartum 04/14/2024    PCP: None  REFERRING PROVIDER: Nicholaus Jorene BRAVO, PA-C  REFERRING DIAG: M54.6 (ICD-10-CM) - Thoracic back pain, unspecified back pain laterality, unspecified chronicity  Rationale for Evaluation and Treatment: Rehabilitation  THERAPY DIAG:  Other low back pain  Abnormal posture  Muscle weakness (generalized)  Cramp and spasm  ONSET DATE: 18 weeks of pregnancy  SUBJECTIVE:                                                                                                                                                                                           SUBJECTIVE STATEMENT: Patient presents with increased back pain that started around 18 weeks of pregnancy. She has trouble sitting and going to sleep because of the pain. When she sits for 20 mins she has increased numbness and tingling in her  legs.  She is having a boy   PERTINENT HISTORY:  Patient is [redacted] weeks pregnant;   PAIN:  Are you having pain? Yes: NPRS scale: 6(currently) 9(worst)/10 Pain location: both sides but mainly the left lumbar spine Pain description: dull; sharp Aggravating factors: standing> ; bending; getting out of the car Relieving factors: pregnancy pillow  PRECAUTIONS: Other: Patient is pregnant   RED FLAGS: None   WEIGHT BEARING RESTRICTIONS: No  FALLS:  Has patient fallen in last 6 months? 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  PATIENT SURVEYS:  Modified Oswestry: 23/50  46%  Interpretation of scores: Score Category Description  0-20% Minimal Disability The patient can cope with most living activities. Usually no treatment is indicated apart from advice on lifting, sitting and exercise  21-40% Moderate Disability The patient experiences more pain and difficulty with sitting, lifting and standing. Travel and social life are more difficult and they may be disabled from work. Personal care, sexual activity and sleeping are not grossly affected, and the patient can usually be managed by conservative means  41-60% Severe Disability Pain remains the main problem in this group, but activities of daily living are affected. These patients require a detailed investigation  61-80% Crippled Back pain impinges on all  aspects of the patients life. Positive intervention is required  81-100% Bed-bound These patients are either bed-bound or exaggerating their symptoms    Minimally Clinically Important Difference (MCID) = 12.8%  COGNITION: Overall cognitive status: Within functional limits for tasks assessed     SENSATION: Numbness and tingling with increased sitting  MUSCLE LENGTH: Hamstrings: decreased bilateral Rt > Lt    POSTURE: increased lumbar lordosis and anterior pelvic tilt  PALPATION: Increased muscle spasms lumbar paraspinals Increased muscle spasms bilateral piriformis   LUMBAR ROM: * pain  AROM eval  Flexion Bends to shins *pain  Extension full  Right lateral flexion Bends past knee joint  Left lateral flexion Bends past knee joint  Right rotation full  Left rotation full   (Blank rows = not tested)  LOWER EXTREMITY ROM:   PROM WFL bilateral    LOWER EXTREMITY MMT:    MMT Right eval Left eval  Hip flexion 4 4+  Hip extension    Hip abduction 4 4  Hip adduction    Hip internal rotation    Hip external rotation    Knee flexion 4 4+  Knee extension 5 5  Ankle dorsiflexion    Ankle plantarflexion    Ankle inversion    Ankle eversion     (Blank rows = not tested)    FUNCTIONAL TESTS:  5 times sit to stand: not tested at eval  GAIT:  Comments: wide BOS  TREATMENT DATE: 08/13/2024 Initial Evaluation & HEP created                                                                                                                              Standing posture   If treatment provided at initial evaluation, no treatment  charged due to lack of authorization.       PATIENT EDUCATION:  Education details: PT eval findings, anticipated POC, progress with PT, and initial HEP Person educated: Patient Education method: Explanation, Demonstration, and Handouts Education comprehension: verbalized understanding, returned demonstration, and needs further education  HOME  EXERCISE PROGRAM: Access Code: [email or web address] URL: [email or web address] Date: 08/13/2024 Prepared by: Kristeen Sar  Exercises - Supine Transversus Abdominis Bracing - Hands on Stomach  - 1 x daily - 7 x weekly - 1-2 sets - 10 reps - 3-4s hold - Sidelying Thoracic Rotation with Open Book  - 1 x daily - 7 x weekly - 1 sets - 10 reps - Cat Cow  - 1 x daily - 7 x weekly - 1 sets - 10 reps - Seated Hamstring Stretch  - 1 x daily - 7 x weekly - 2 sets - 20-30s hold - Seated Piriformis Stretch  - 1 x daily - 7 x weekly - 2 sets - 20-30s hold  ASSESSMENT:  CLINICAL IMPRESSION: Patient is a 18 y.o. female who was seen today for physical therapy evaluation and treatment for back in pregnancy. Lougenia is [redacted] weeks pregnant and presents with increased back pain that is interfering with her ability to sit and sleep comfortably. She reports having numbness and tingling in her legs after sitting for 20 mins. 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 Based on evaluation noted increased muscle spasms, poor posture, and muscle weakness. She stands with an anterior pelvic tilt, so demonstrated correct standing posture to prevent increased forces on lumbar spine. Patient is highly motivated and wants to get rid of back pain. Educated patient on normal course of low back pain in pregnancy and the importance of maintaining a normal exercise program. Patient will benefit from skilled PT to address the below impairments and improve overall function.  OBJECTIVE IMPAIRMENTS: Abnormal gait, decreased endurance, difficulty walking, decreased ROM, decreased strength, increased muscle spasms, impaired flexibility, impaired sensation, postural dysfunction, and pain.   ACTIVITY LIMITATIONS: carrying, lifting, bending, sitting, standing, squatting, sleeping, stairs, transfers, dressing, hygiene/grooming, and locomotion level  PARTICIPATION LIMITATIONS:  cleaning, laundry, driving, community activity, and occupation  PERSONAL FACTORS: Fitness and Time since onset of injury/illness/exacerbation are also affecting patient's functional outcome.   REHAB POTENTIAL: Good  CLINICAL DECISION MAKING: Stable/uncomplicated  EVALUATION COMPLEXITY: Low   GOALS: Goals reviewed with patient? Yes  SHORT TERM GOALS: Target date: 09/10/2024  Patient will be independent with initial HEP. Baseline:  Goal status: INITIAL  2.  Patient will report > or = to 40% improvement in low back pain since starting PT. Baseline:  Goal status: INITIAL   3.  Patient will demonstrate correct TA activation and breathing technique while performing bed mobility with no verbal cues. Baseline:  Goal status: INITIAL  4.  Patient will be able to perform showering and self care activities without assist from her mother. Baseline:  Goal status: INITIAL   LONG TERM GOALS: Target date: 10/08/2024 Patient will demonstrate independence in advanced HEP. Baseline:  Goal status: INITIAL  2.  Patient will report > or = to 70% improvement in low back pain since starting PT. Baseline:  Goal status: INITIAL  3.  Patient will verbalize and demonstrate self-care strategies to manage pain including tissue mobility practices and change of position. Baseline:  Goal status: INITIAL   4.   Patient will verbalize and demonstrate correct body mechanics for transfers  and lifting to optimize mobility of current pregnancy Baseline:  Goal status: INITIAL  5.  Patient will demonstrate improved lower extremity and strength to at least 4+/5 enabling better support for pelvic alignment and reduced compensatory patterns. Baseline:  Goal status: INITIAL  6.  Patient will demonstrate the ability to walk for 30 minutes at a comfortable pace without increased low back or pelvic pain, supporting daily function and prenatal exercise recommendations. Baseline:  Goal status:  INITIAL  PLAN:  PT FREQUENCY: 2x/week  PT DURATION: 8 weeks  PLANNED INTERVENTIONS: 97164- PT Re-evaluation, 97110-Therapeutic exercises, 97530- Therapeutic activity, 97112- Neuromuscular re-education, 97535- Self Care, 02859- Manual therapy, [phone]- Gait training, [phone]- Canalith repositioning, [id number]- Aquatic Therapy, [phone]- Electrical stimulation (unattended), [phone]- Electrical stimulation (manual), 97016- Vasopneumatic device, 20560 (1-2 muscles), 20561 (3+ muscles)- Dry Needling, Patient/Family education, Balance training, Stair training, Taping, Joint mobilization, Joint manipulation, Spinal manipulation, Spinal mobilization, Vestibular training, Cryotherapy, and Moist heat.  PLAN FOR NEXT SESSION: Review HEP; lumbar and hip mobility/ flexibility; TA activation; postural strengthening; review standing posture   Kristeen Sar, PT, DPT 08/13/24 9:33 AM [facility] [address] [place], [place] [address] Phone # [phone] Fax [phone]   PHYSICAL THERAPY DISCHARGE SUMMARY  Visits from Start of Care: 1  Patient agrees to discharge. Patient goals were not met. Patient is being discharged due to not returning since the last visit.  Kristeen Sar, PT, DPT 09/16/2024 9:17 AM  "

## 2024-08-13 ENCOUNTER — Other Ambulatory Visit: Payer: Self-pay | Admitting: *Deleted

## 2024-08-13 ENCOUNTER — Ambulatory Visit: Attending: Physician Assistant | Admitting: Physical Therapy

## 2024-08-13 ENCOUNTER — Other Ambulatory Visit: Payer: Self-pay

## 2024-08-13 ENCOUNTER — Encounter: Payer: Self-pay | Admitting: Physical Therapy

## 2024-08-13 DIAGNOSIS — O09892 Supervision of other high risk pregnancies, second trimester: Secondary | ICD-10-CM

## 2024-08-13 DIAGNOSIS — M546 Pain in thoracic spine: Secondary | ICD-10-CM | POA: Diagnosis not present

## 2024-08-13 DIAGNOSIS — M5459 Other low back pain: Secondary | ICD-10-CM | POA: Insufficient documentation

## 2024-08-13 DIAGNOSIS — R252 Cramp and spasm: Secondary | ICD-10-CM | POA: Insufficient documentation

## 2024-08-13 DIAGNOSIS — M6281 Muscle weakness (generalized): Secondary | ICD-10-CM | POA: Insufficient documentation

## 2024-08-13 DIAGNOSIS — R293 Abnormal posture: Secondary | ICD-10-CM | POA: Diagnosis present

## 2024-08-14 ENCOUNTER — Ambulatory Visit

## 2024-08-20 ENCOUNTER — Encounter: Payer: Self-pay | Admitting: Physical Therapy

## 2024-08-23 ENCOUNTER — Encounter: Payer: Self-pay | Admitting: Physical Therapy

## 2024-09-01 ENCOUNTER — Ambulatory Visit: Admitting: Family Medicine

## 2024-09-01 ENCOUNTER — Other Ambulatory Visit

## 2024-09-01 ENCOUNTER — Encounter: Payer: Self-pay | Admitting: Family Medicine

## 2024-09-01 VITALS — BP 120/70 | HR 83 | Wt 152.6 lb

## 2024-09-01 DIAGNOSIS — Z3A28 28 weeks gestation of pregnancy: Secondary | ICD-10-CM

## 2024-09-01 DIAGNOSIS — Z3480 Encounter for supervision of other normal pregnancy, unspecified trimester: Secondary | ICD-10-CM

## 2024-09-01 DIAGNOSIS — K117 Disturbances of salivary secretion: Secondary | ICD-10-CM

## 2024-09-01 MED ORDER — GLYCOPYRROLATE 1 MG PO TABS: 1.0000 mg | ORAL_TABLET | Freq: Three times a day (TID) | ORAL | 3 refills | Status: DC

## 2024-09-01 NOTE — Progress Notes (Addendum)
 PRENATAL VISIT NOTE  Subjective:  Karen Koch is a 18 y.o. G1P0000 at [redacted]w[redacted]d being seen today for ongoing prenatal care.  She is currently monitored for the following issues for this low-risk pregnancy and has Supervision of other normal pregnancy, antepartum; UTI due to Klebsiella species; Excessive salivation while pregnant; Nausea and vomiting during pregnancy; Positive depression screening; and High risk teen pregnancy on their problem list.  Patient reports backache.  Contractions: Not present. Vag. Bleeding: None.  Movement: Present. Denies leaking of fluid.   The following portions of the patient's history were reviewed and updated as appropriate: allergies, current medications, past family history, past medical history, past social history, past surgical history and problem list.   Objective:   Vitals:   09/01/24 0920  BP: 120/70  Pulse: 83  Weight: 152 lb 9.6 oz (69.2 kg)    Fetal Status:  Fetal Heart Rate (bpm): 142 Fundal Height: 28 cm Movement: Present    General: Alert, oriented and cooperative. Patient is in no acute distress.  Skin: Skin is warm and dry. No rash noted.   Cardiovascular: Normal heart rate noted  Respiratory: Normal respiratory effort, no problems with respiration noted  Abdomen: Soft, gravid, appropriate for gestational age.  Pain/Pressure: Present     Pelvic: Cervical exam deferred        Extremities: Normal range of motion.  Edema: None  Mental Status: Normal mood and affect. Normal behavior. Normal judgment and thought content.      09/01/2024    9:24 AM 05/12/2024    3:32 PM 04/14/2024    3:45 PM  Depression screen PHQ 2/9  Decreased Interest 0 2 0  Down, Depressed, Hopeless 0 0 0  PHQ - 2 Score 0 2 0  Altered sleeping 0 3 0  Tired, decreased energy 2 2 0  Change in appetite 0 2 0  Feeling bad or failure about yourself  0 0 0  Trouble concentrating 2 1 1   Moving slowly or fidgety/restless 0 0 1  Suicidal thoughts 0 0 0  PHQ-9 Score 4 10   2       Data saved with a previous flowsheet row definition        09/01/2024    9:25 AM 05/12/2024    3:33 PM 04/14/2024    3:45 PM  GAD 7 : Generalized Anxiety Score  Nervous, Anxious, on Edge 2 1 1   Control/stop worrying 0 1 0  Worry too much - different things 0 1 0  Trouble relaxing 0 1 0  Restless 0 2 0  Easily annoyed or irritable 1 2 1   Afraid - awful might happen 0 0 0  Total GAD 7 Score 3 8 2     Assessment and Plan:  Pregnancy: G1P0000 at [redacted]w[redacted]d 1. Supervision of other normal pregnancy, antepartum (Primary) Left sided back pain, discussed usual remedies, including heat, position changes - Tdap vaccine greater than or equal to 7yo IM - Flu vaccine trivalent PF, 6mos and older(Flulaval,Afluria,Fluarix,Fluzone)  2. [redacted] weeks gestation of pregnancy Note written for work to limit lifting  3. Excessive salivation while pregnant Staying hydrated. Would like to try meds, now that she can keep pills down. - glycopyrrolate  (ROBINUL ) 1 MG tablet; Take 1 tablet (1 mg total) by mouth 3 (three) times daily.  Dispense: 90 tablet; Refill: 3  Preterm labor symptoms and general obstetric precautions including but not limited to vaginal bleeding, contractions, leaking of fluid and fetal movement were reviewed in detail with the patient. Please  refer to After Visit Summary for other counseling recommendations.   Return in 2 weeks (on 09/15/2024).  Future Appointments  Date Time Provider Department Center  09/03/2024  8:45 AM Janit Paris, PT [facility] None  09/05/2024  8:45 AM Janit Paris, PT [facility] None  09/10/2024  8:45 AM Harvey Nest B, PT [facility] None  09/12/2024  8:00 AM Janit Paris, PT [facility] None  09/16/2024  8:45 AM Janit Paris, PT [facility] None  09/23/2024  8:45 AM Janit Paris, PT [facility] None  09/26/2024  8:45 AM Harvey Nest B, PT [facility] None  09/30/2024  8:15 AM [facility] PROVIDER 1 [facility]  09/30/2024  8:30 AM [facility] US2 [facility]  10/01/2024   9:30 AM Janit Paris, PT [facility] None  10/03/2024  8:45 AM Harvey Nest NOVAK, PT [facility] None  10/06/2024  8:45 AM Janit Paris, PT [facility] None  10/07/2024  8:15 AM [facility] PROVIDER 1 [facility]  10/07/2024  8:30 AM [facility] US2 [facility]  10/08/2024  8:45 AM Janit Paris, PT [facility] None    Glenys GORMAN Birk, MD

## 2024-09-01 NOTE — Progress Notes (Signed)
 Pt presents for ROB visit. C/o back pain. Requesting flu and tdap vaccines today

## 2024-09-02 ENCOUNTER — Ambulatory Visit: Payer: Self-pay | Admitting: Family Medicine

## 2024-09-02 DIAGNOSIS — Z3480 Encounter for supervision of other normal pregnancy, unspecified trimester: Secondary | ICD-10-CM

## 2024-09-02 LAB — CBC
Hematocrit: 33.3 % — ABNORMAL LOW (ref 34.0–46.6)
Hemoglobin: 11.3 g/dL (ref 11.1–15.9)
MCH: 31.2 pg (ref 26.6–33.0)
MCHC: 33.9 g/dL (ref 31.5–35.7)
MCV: 92 fL (ref 79–97)
Platelets: 209 x10E3/uL (ref 150–450)
RBC: 3.62 x10E6/uL — ABNORMAL LOW (ref 3.77–5.28)
RDW: 11.8 % (ref 11.7–15.4)
WBC: 7.4 x10E3/uL (ref 3.4–10.8)

## 2024-09-02 LAB — HIV ANTIBODY (ROUTINE TESTING W REFLEX): HIV Screen 4th Generation wRfx: NONREACTIVE

## 2024-09-02 LAB — GLUCOSE TOLERANCE, 2 HOURS W/ 1HR
Glucose, 1 hour: 148 mg/dL (ref 70–179)
Glucose, 2 hour: 113 mg/dL (ref 70–152)
Glucose, Fasting: 72 mg/dL (ref 70–91)

## 2024-09-02 LAB — SYPHILIS: RPR W/REFLEX TO RPR TITER AND TREPONEMAL ANTIBODIES, TRADITIONAL SCREENING AND DIAGNOSIS ALGORITHM: RPR Ser Ql: NONREACTIVE

## 2024-09-03 ENCOUNTER — Ambulatory Visit: Attending: Physician Assistant | Admitting: Physical Therapy

## 2024-09-03 ENCOUNTER — Telehealth: Payer: Self-pay | Admitting: Physical Therapy

## 2024-09-03 DIAGNOSIS — M546 Pain in thoracic spine: Secondary | ICD-10-CM | POA: Insufficient documentation

## 2024-09-03 DIAGNOSIS — R252 Cramp and spasm: Secondary | ICD-10-CM | POA: Insufficient documentation

## 2024-09-03 DIAGNOSIS — M5459 Other low back pain: Secondary | ICD-10-CM | POA: Insufficient documentation

## 2024-09-03 DIAGNOSIS — R293 Abnormal posture: Secondary | ICD-10-CM | POA: Insufficient documentation

## 2024-09-03 DIAGNOSIS — M6281 Muscle weakness (generalized): Secondary | ICD-10-CM | POA: Insufficient documentation

## 2024-09-03 NOTE — Telephone Encounter (Signed)
 Left patient a message about missed appointment. Reminded patient of next scheduled appointment on Dec 12th. Left a call back number for the clinic.   Dreonna Hussein, PT, DPT 09/03/24 9:15 AM

## 2024-09-05 ENCOUNTER — Ambulatory Visit: Admitting: Physical Therapy

## 2024-09-10 ENCOUNTER — Telehealth: Payer: Self-pay

## 2024-09-10 ENCOUNTER — Ambulatory Visit

## 2024-09-10 NOTE — Telephone Encounter (Signed)
 Call placed to inform patient that she had an appt this morning at 8:45.  Noted that she had cancelled her last appt and had not shown for the one before that.  Explained attendance policy and that we would need to remove the rest of her appts if she did not show for her next appt or call us . Left phone number and the date and time of her next appt.

## 2024-09-12 ENCOUNTER — Ambulatory Visit: Admitting: Physical Therapy

## 2024-09-15 ENCOUNTER — Ambulatory Visit (INDEPENDENT_AMBULATORY_CARE_PROVIDER_SITE_OTHER): Admitting: Obstetrics

## 2024-09-15 ENCOUNTER — Encounter: Payer: Self-pay | Admitting: Obstetrics

## 2024-09-15 VITALS — BP 113/69 | HR 86 | Wt 157.2 lb

## 2024-09-15 DIAGNOSIS — N390 Urinary tract infection, site not specified: Secondary | ICD-10-CM | POA: Diagnosis not present

## 2024-09-15 DIAGNOSIS — O09893 Supervision of other high risk pregnancies, third trimester: Secondary | ICD-10-CM

## 2024-09-15 DIAGNOSIS — Z3A30 30 weeks gestation of pregnancy: Secondary | ICD-10-CM

## 2024-09-15 DIAGNOSIS — B9689 Other specified bacterial agents as the cause of diseases classified elsewhere: Secondary | ICD-10-CM

## 2024-09-15 NOTE — Progress Notes (Signed)
 Subjective:  Karen Koch is a 18 y.o. G1P0000 at [redacted]w[redacted]d being seen today for ongoing prenatal care.  She is currently monitored for the following issues for this high-risk pregnancy and has Supervision of other normal pregnancy, antepartum; UTI due to Klebsiella species; Excessive salivation while pregnant; Nausea and vomiting during pregnancy; Positive depression screening; and High risk teen pregnancy on their problem list.  Patient reports no complaints.   .  .   . Denies leaking of fluid.   The following portions of the patient's history were reviewed and updated as appropriate: allergies, current medications, past family history, past medical history, past social history, past surgical history and problem list. Problem list updated.  Objective:  There were no vitals filed for this visit.  Fetal Status:           General:  Alert, oriented and cooperative. Patient is in no acute distress.  Skin: Skin is warm and dry. No rash noted.   Cardiovascular: Normal heart rate noted  Respiratory: Normal respiratory effort, no problems with respiration noted  Abdomen: Soft, gravid, appropriate for gestational age.       Pelvic:  Cervical exam deferred        Extremities: Normal range of motion.     Mental Status: Normal mood and affect. Normal behavior. Normal judgment and thought content.   Urinalysis:      Assessment and Plan:  Pregnancy: G1P0000 at [redacted]w[redacted]d  1. High risk teen pregnancy in third trimester (Primary)  2. UTI due to Klebsiella species - repeat culture at 36 weeks    Preterm labor symptoms and general obstetric precautions including but not limited to vaginal bleeding, contractions, leaking of fluid and fetal movement were reviewed in detail with the patient. Please refer to After Visit Summary for other counseling recommendations.   Return in about 2 weeks (around 09/29/2024) for ROB.   Rudy Carlin LABOR, MD 09/15/2024

## 2024-09-15 NOTE — Progress Notes (Signed)
 Pt presents for rob. Pt has no questions or concerns at this time.

## 2024-09-16 ENCOUNTER — Encounter: Payer: Self-pay | Admitting: Physical Therapy

## 2024-09-16 ENCOUNTER — Ambulatory Visit: Admitting: Physical Therapy

## 2024-09-23 ENCOUNTER — Other Ambulatory Visit: Payer: Self-pay

## 2024-09-23 ENCOUNTER — Ambulatory Visit: Admitting: Physical Therapy

## 2024-09-23 ENCOUNTER — Encounter (HOSPITAL_COMMUNITY): Payer: Self-pay | Admitting: Obstetrics and Gynecology

## 2024-09-23 ENCOUNTER — Inpatient Hospital Stay (HOSPITAL_COMMUNITY)
Admission: AD | Admit: 2024-09-23 | Discharge: 2024-09-23 | Disposition: A | Discharge status: Home / Self Care | Source: Home / Self Care | Attending: Obstetrics and Gynecology | Admitting: Obstetrics and Gynecology

## 2024-09-23 DIAGNOSIS — R6889 Other general symptoms and signs: Secondary | ICD-10-CM

## 2024-09-23 DIAGNOSIS — O368130 Decreased fetal movements, third trimester, not applicable or unspecified: Secondary | ICD-10-CM | POA: Diagnosis present

## 2024-09-23 DIAGNOSIS — O26893 Other specified pregnancy related conditions, third trimester: Secondary | ICD-10-CM | POA: Diagnosis not present

## 2024-09-23 DIAGNOSIS — J101 Influenza due to other identified influenza virus with other respiratory manifestations: Secondary | ICD-10-CM | POA: Diagnosis not present

## 2024-09-23 DIAGNOSIS — Z3689 Encounter for other specified antenatal screening: Secondary | ICD-10-CM

## 2024-09-23 DIAGNOSIS — O99513 Diseases of the respiratory system complicating pregnancy, third trimester: Secondary | ICD-10-CM | POA: Insufficient documentation

## 2024-09-23 DIAGNOSIS — Z3480 Encounter for supervision of other normal pregnancy, unspecified trimester: Secondary | ICD-10-CM

## 2024-09-23 DIAGNOSIS — R0981 Nasal congestion: Secondary | ICD-10-CM

## 2024-09-23 DIAGNOSIS — Z3493 Encounter for supervision of normal pregnancy, unspecified, third trimester: Secondary | ICD-10-CM

## 2024-09-23 DIAGNOSIS — Z3A31 31 weeks gestation of pregnancy: Secondary | ICD-10-CM | POA: Diagnosis not present

## 2024-09-23 LAB — WET PREP, GENITAL
Clue Cells Wet Prep HPF POC: NONE SEEN
Sperm: NONE SEEN
Trich, Wet Prep: NONE SEEN
WBC, Wet Prep HPF POC: <10
Yeast Wet Prep HPF POC: NONE SEEN

## 2024-09-23 LAB — URINALYSIS, ROUTINE W REFLEX MICROSCOPIC
Bilirubin Urine: NEGATIVE
Glucose, UA: NEGATIVE mg/dL
Hgb urine dipstick: NEGATIVE
Ketones, ur: 5 mg/dL — AB
Leukocytes,Ua: NEGATIVE
Nitrite: NEGATIVE
Protein, ur: 30 mg/dL — AB
Specific Gravity, Urine: 1.026 (ref 1.005–1.030)
pH: 5 (ref 5.0–8.0)

## 2024-09-23 LAB — CBC
HCT: 31.4 % — ABNORMAL LOW (ref 36.0–46.0)
Hemoglobin: 10.5 g/dL — ABNORMAL LOW (ref 12.0–15.0)
MCH: 30.1 pg (ref 26.0–34.0)
MCHC: 33.4 g/dL (ref 30.0–36.0)
MCV: 90 fL (ref 80.0–100.0)
Platelets: 199 K/uL (ref 150–400)
RBC: 3.49 MIL/uL — ABNORMAL LOW (ref 3.87–5.11)
RDW: 11.9 % (ref 11.5–15.5)
WBC: 5.5 K/uL (ref 4.0–10.5)
nRBC: 0 % (ref 0.0–0.2)

## 2024-09-23 LAB — RUPTURE OF MEMBRANE (ROM)PLUS: Rom Plus: NEGATIVE

## 2024-09-23 LAB — RESP PANEL BY RT-PCR (RSV, FLU A&B, COVID)  RVPGX2
Influenza A by PCR: NEGATIVE
Influenza B by PCR: POSITIVE — AB
Resp Syncytial Virus by PCR: NEGATIVE
SARS Coronavirus 2 by RT PCR: NEGATIVE

## 2024-09-23 MED ORDER — FLUTICASONE PROPIONATE 50 MCG/ACT NA SUSP
2.0000 | Freq: Once | NASAL | Status: AC
  Administered 2024-09-23: 2 via NASAL
  Filled 2024-09-23 (×2): qty 16

## 2024-09-23 MED ORDER — OSELTAMIVIR PHOSPHATE 75 MG PO CAPS: 75.0000 mg | ORAL_CAPSULE | Freq: Two times a day (BID) | ORAL | 0 refills | Status: DC

## 2024-09-23 MED ORDER — DEXTROMETHORPHAN POLISTIREX ER 30 MG/5ML PO SUER
30.0000 mg | Freq: Once | ORAL | Status: AC
  Administered 2024-09-23: 30 mg via ORAL
  Filled 2024-09-23: qty 5

## 2024-09-23 MED ORDER — ACETAMINOPHEN-CAFFEINE 500-65 MG PO TABS
2.0000 | ORAL_TABLET | Freq: Once | ORAL | Status: AC
  Administered 2024-09-23: 2 via ORAL
  Filled 2024-09-23: qty 2

## 2024-09-23 NOTE — MAU Note (Signed)
 Jon Kasparek is a 18 y.o. at [redacted]w[redacted]d here in MAU reporting: she has congestion, sore throat, HA, and body aches that began 3 days ago.  Reports she's been taking cough drops, Benadryl and Tylenol  to treat symptoms but no relief noted.  Denies VB, states had fluid gush out while on commode approximately 1 hour ago.  Reports hasn't had fetal movement since yesterday.  LMP: 02/15/2024 Onset of complaint: 3 days ago Pain score: 8 HA & 7 sore throat Vitals:   09/23/24 1144  BP: 109/61  Pulse: 100  Resp: 18  Temp: 99.2 F (37.3 C)  SpO2: 100%     FHT: 152 bpm  Lab orders placed from triage: Resp Panel

## 2024-09-23 NOTE — Discharge Instructions (Signed)

## 2024-09-23 NOTE — MAU Provider Note (Signed)
 Chief Complaint:  Headache, Body Aches, Congestion, and Sore Throat   HPI   Event Date/Time   First Provider Initiated Contact with Patient 09/23/24 1218      Karen Koch is a 18 y.o. G1P0000 at [redacted]w[redacted]d who presents to maternity admissions reporting she has congestion, sore throat, headache and bodyaches with flulike symptoms.  Patient reports she has been taking over-the-counter medications with no relief.  She denies vaginal bleeding, leaking of fluid, contractions but reports she had decreased fetal movement since yesterday.   Pregnancy Course: [facility]   Past Medical History:  Diagnosis Date   Dysmenorrhea in adolescent 12/07/2020   Medical history non-contributory    OB History  Gravida Para Term Preterm AB Living  1 0 0 0 0 0  SAB IAB Ectopic Multiple Live Births  0 0 0 0 0    # Outcome Date GA Lbr Len/2nd Weight Sex Type Anes PTL Lv  1 Current            Past Surgical History:  Procedure Laterality Date   WISDOM TOOTH EXTRACTION     Family History  Problem Relation Age of Onset   Seizures Mother    AAA (abdominal aortic aneurysm) Father    Diabetes Paternal Grandmother    Anemia Paternal Grandfather    Social History[1] Allergies[2] Medications Prior to Admission  Medication Sig Dispense Refill Last Dose/Taking   glycopyrrolate  (ROBINUL ) 1 MG tablet Take 1 tablet (1 mg total) by mouth 3 (three) times daily. 90 tablet 3 09/22/2024   ondansetron  (ZOFRAN -ODT) 4 MG disintegrating tablet Take 1 tablet (4 mg total) by mouth every 6 (six) hours as needed for nausea. 30 tablet 2 09/22/2024   Prenatal 28-0.8 MG TABS Take 1 tablet by mouth daily. 30 tablet 12 09/22/2024   aspirin  EC 81 MG tablet Take 1 tablet (81 mg total) by mouth daily. Swallow whole. (Patient not taking: Reported on 07/07/2024) 30 tablet 6     I have reviewed patient's Past Medical Hx, Surgical Hx, Family Hx, Social Hx, medications and allergies.   ROS  Pertinent items noted in HPI and remainder of  comprehensive ROS otherwise negative.   PHYSICAL EXAM  Patient Vitals for the past 24 hrs:  BP Temp Temp src Pulse Resp SpO2 Height Weight  09/23/24 1144 109/61 99.2 F (37.3 C) Oral 100 18 100 % -- --  09/23/24 1136 -- -- -- -- -- -- 5' 7 (1.702 m) 70.1 kg    Constitutional: Well-developed, well-nourished female who appears sick Cardiovascular: normal rate & rhythm, warm and well-perfused Respiratory: normal effort, no problems with respiration noted GI: Abd soft, non-tender, gravid MS: Extremities nontender, no edema, normal ROM Neurologic: Alert and oriented x 4.      Fetal Tracing: Nst Reactive Baseline: 140 Variability: moderate  Accelerations: present Decelerations: absent Toco: UI   Labs: Results for orders placed or performed during the hospital encounter of 09/23/24 (from the past 24 hours)  Resp panel by RT-PCR (RSV, Flu A&B, Covid) Anterior Nasal Swab     Status: Abnormal   Collection Time: 09/23/24 11:47 AM   Specimen: Anterior Nasal Swab  Result Value Ref Range   SARS Coronavirus 2 by RT PCR NEGATIVE NEGATIVE   Influenza A by PCR NEGATIVE NEGATIVE   Influenza B by PCR POSITIVE (A) NEGATIVE   Resp Syncytial Virus by PCR NEGATIVE NEGATIVE  Urinalysis, Routine w reflex microscopic -Urine, Random     Status: Abnormal   Collection Time: 09/23/24 12:44 PM  Result  Value Ref Range   Color, Urine AMBER (A) YELLOW   APPearance CLOUDY (A) CLEAR   Specific Gravity, Urine 1.026 1.005 - 1.030   pH 5.0 5.0 - 8.0   Glucose, UA NEGATIVE NEGATIVE mg/dL   Hgb urine dipstick NEGATIVE NEGATIVE   Bilirubin Urine NEGATIVE NEGATIVE   Ketones, ur 5 (A) NEGATIVE mg/dL   Protein, ur 30 (A) NEGATIVE mg/dL   Nitrite NEGATIVE NEGATIVE   Leukocytes,Ua NEGATIVE NEGATIVE   RBC / HPF 0-5 0 - 5 RBC/hpf   WBC, UA 0-5 0 - 5 WBC/hpf   Bacteria, UA RARE (A) NONE SEEN   Squamous Epithelial / HPF 0-5 0 - 5 /HPF   Mucus PRESENT   CBC     Status: Abnormal   Collection Time: 09/23/24 12:53  PM  Result Value Ref Range   WBC 5.5 4.0 - 10.5 K/uL   RBC 3.49 (L) 3.87 - 5.11 MIL/uL   Hemoglobin 10.5 (L) 12.0 - 15.0 g/dL   HCT 68.5 (L) 63.9 - 53.9 %   MCV 90.0 80.0 - 100.0 fL   MCH 30.1 26.0 - 34.0 pg   MCHC 33.4 30.0 - 36.0 g/dL   RDW 88.0 88.4 - 84.4 %   Platelets 199 150 - 400 K/uL   nRBC 0.0 0.0 - 0.2 %  Wet prep, genital     Status: None   Collection Time: 09/23/24 12:55 PM  Result Value Ref Range   Yeast Wet Prep HPF POC NONE SEEN NONE SEEN   Trich, Wet Prep NONE SEEN NONE SEEN   Clue Cells Wet Prep HPF POC NONE SEEN NONE SEEN   WBC, Wet Prep HPF POC <10 <10   Sperm NONE SEEN   Rupture of Membrane (ROM) Plus     Status: None   Collection Time: 09/23/24 12:55 PM  Result Value Ref Range   Rom Plus NEGATIVE     Imaging:  No results found.  MDM & MAU COURSE  MDM:  HIGH  Available prenatal records reviewed Physical exam performed Respiratory panel ( Flu Positive) CBC: Hgb 10.5 ( On Iron Supplements) UA: proteinuria  Wet prep: Negative ROM plus: Negative NST for gestational age and fetal reassurance-reactive NST and patient reports she feels fetal movements via on the clicker on NST as well as verbally reporting Delsym  for cough Flonase  for sinus congestion Excedrin for HA  MAU Course: Orders Placed This Encounter  Procedures   Resp panel by RT-PCR (RSV, Flu A&B, Covid) Anterior Nasal Swab   Wet prep, genital   CBC   Urinalysis, Routine w reflex microscopic -Urine, Random   Rupture of Membrane (ROM) Plus   Airborne and Contact precautions   Discharge patient Discharge disposition: 01-Home or Self Care; Discharge patient date: 09/23/2024   Meds ordered this encounter  Medications   dextromethorphan  (DELSYM ) 30 MG/5ML liquid 30 mg   fluticasone  (FLONASE ) 50 MCG/ACT nasal spray 2 spray   acetaminophen -caffeine  (EXCEDRIN TENSION HEADACHE) 500-65 MG per tablet 2 tablet   oseltamivir (TAMIFLU) 75 MG capsule    Sig: Take 1 capsule (75 mg total) by mouth  every 12 (twelve) hours.    Dispense:  10 capsule    Refill:  0    Supervising Provider:   PRATT, TANYA S [[id number]]      I have reviewed the patient chart and performed the physical exam . I have ordered & interpreted the lab results and reviewed and interpreted the results and discussed with t`he patient. Influenza Positive. S/R/B of Tamiflu  d/w patient  Medications ordered as stated above/ below.  A/P as described below.  Counseling and education provided to the patient on influenza in pregnancy and S/R/B of Tamiflu  and patient agreeable  with plan as described below. Verbalized understanding.    ASSESSMENT   1. Supervision of other normal pregnancy, antepartum   2. Influenza B   3. Flu-like symptoms   4. Congestion of nasal sinus   5. Decreased fetal movements in third trimester, single or unspecified fetus   6. [redacted] weeks gestation of pregnancy   7. Intact amniotic membranes during pregnancy in third trimester   8. NST (non-stress test) reactive on fetal surveillance     PLAN  Discharge home in stable condition with return precautions.   Fetal kick counts   Tamiflu with S/R/B   List of OTC meds safe in pregnancy provided to the patient  See AVS for full description of information given to the patient including both verbal and written. Patient verbalized understanding and agrees with the plan as described above.     Follow-up Information     [facility] Follow up.   Why: If symptoms worsen or fail to resolve, As scheduled for ongoing prenatal care Contact information: [address] [place]   [address] [phone]                Allergies as of 09/23/2024   No Known Allergies      Medication List     TAKE these medications    aspirin  EC 81 MG tablet Take 1 tablet (81 mg total) by mouth daily. Swallow whole.   glycopyrrolate  1 MG tablet Commonly known as: Robinul  Take 1 tablet (1 mg total) by mouth 3 (three)  times daily.   ondansetron  4 MG disintegrating tablet Commonly known as: ZOFRAN -ODT Take 1 tablet (4 mg total) by mouth every 6 (six) hours as needed for nausea.   oseltamivir 75 MG capsule Commonly known as: TAMIFLU Take 1 capsule (75 mg total) by mouth every 12 (twelve) hours.   Prenatal 28-0.8 MG Tabs Take 1 tablet by mouth daily.        Karen Dalton, MSN, WHNP-BC [facility], [facility] [employer]       [1]  Social History Tobacco Use   Smoking status: Never   Smokeless tobacco: Never  Vaping Use   Vaping status: Never Used  Substance Use Topics   Alcohol use: Never   Drug use: Never  [2] No Known Allergies

## 2024-09-24 LAB — GC/CHLAMYDIA PROBE AMP (~~LOC~~) NOT AT ARMC
Chlamydia: NEGATIVE
Comment: NEGATIVE
Comment: NORMAL
Neisseria Gonorrhea: NEGATIVE

## 2024-09-26 ENCOUNTER — Ambulatory Visit

## 2024-09-30 ENCOUNTER — Encounter: Payer: Self-pay | Admitting: Advanced Practice Midwife

## 2024-09-30 ENCOUNTER — Ambulatory Visit

## 2024-09-30 ENCOUNTER — Ambulatory Visit (INDEPENDENT_AMBULATORY_CARE_PROVIDER_SITE_OTHER): Payer: Self-pay | Admitting: Advanced Practice Midwife

## 2024-09-30 VITALS — BP 117/69 | HR 86 | Wt 158.0 lb

## 2024-09-30 DIAGNOSIS — Z362 Encounter for other antenatal screening follow-up: Secondary | ICD-10-CM

## 2024-09-30 DIAGNOSIS — Z3A32 32 weeks gestation of pregnancy: Secondary | ICD-10-CM

## 2024-09-30 DIAGNOSIS — Z3480 Encounter for supervision of other normal pregnancy, unspecified trimester: Secondary | ICD-10-CM

## 2024-09-30 NOTE — Progress Notes (Signed)
 "  PRENATAL VISIT NOTE  Subjective:  Karen Koch is a 19 y.o. G1P0000 at [redacted]w[redacted]d being seen today for ongoing prenatal care.  She is currently monitored for the following issues for this low-risk pregnancy and has Supervision of other normal pregnancy, antepartum; UTI due to Klebsiella species; Excessive salivation while pregnant; Nausea and vomiting during pregnancy; Positive depression screening; and High risk teen pregnancy on their problem list.  Patient reports occasional contractions.  Contractions: Irritability. Vag. Bleeding: None.  Movement: Present. Denies leaking of fluid.   The following portions of the patient's history were reviewed and updated as appropriate: allergies, current medications, past family history, past medical history, past social history, past surgical history and problem list.   Objective:   Vitals:   09/30/24 1440  BP: 117/69  Pulse: 86  Weight: 71.7 kg    Fetal Status:  Fetal Heart Rate (bpm): 137   Movement: Present    General: Alert, oriented and cooperative. Patient is in no acute distress.  Skin: Skin is warm and dry. No rash noted.   Cardiovascular: Normal heart rate noted  Respiratory: Normal respiratory effort, no problems with respiration noted  Abdomen: Soft, gravid, appropriate for gestational age.  Pain/Pressure: Present     Pelvic: Cervical exam deferred        Extremities: Normal range of motion.  Edema: Trace  Mental Status: Normal mood and affect. Normal behavior. Normal judgment and thought content.      09/01/2024    9:24 AM 05/12/2024    3:32 PM 04/14/2024    3:45 PM  Depression screen PHQ 2/9  Decreased Interest 0 2 0  Down, Depressed, Hopeless 0 0 0  PHQ - 2 Score 0 2 0  Altered sleeping 0 3 0  Tired, decreased energy 2 2 0  Change in appetite 0 2 0  Feeling bad or failure about yourself  0 0 0  Trouble concentrating 2 1 1   Moving slowly or fidgety/restless 0 0 1  Suicidal thoughts 0 0 0  PHQ-9 Score 4 10  2       Data  saved with a previous flowsheet row definition        09/01/2024    9:25 AM 05/12/2024    3:33 PM 04/14/2024    3:45 PM  GAD 7 : Generalized Anxiety Score  Nervous, Anxious, on Edge 2 1 1   Control/stop worrying 0 1 0  Worry too much - different things 0 1 0  Trouble relaxing 0 1 0  Restless 0 2 0  Easily annoyed or irritable 1 2 1   Afraid - awful might happen 0 0 0  Total GAD 7 Score 3 8 2     Assessment and Plan:  Pregnancy: G1P0000 at [redacted]w[redacted]d 1. Supervision of other normal pregnancy, antepartum (Primary) --Anticipatory guidance about next visits/weeks of pregnancy given.  - Pt is interested in waterbirth.  No contraindications at this time per chart review/patient assessment.   - Pt to enroll in class, see CNMs for most visits in the office.  - Discussed waterbirth as option for low-risk pregnancy.  Reviewed conditions that may arise during pregnancy that will risk pt out of waterbirth including hypertension, diabetes, fetal growth restriction <10%ile, etc.   2. [redacted] weeks gestation of pregnancy   Preterm labor symptoms and general obstetric precautions including but not limited to vaginal bleeding, contractions, leaking of fluid and fetal movement were reviewed in detail with the patient. Please refer to After Visit Summary for other counseling recommendations.  Return for Midwife preferred.  Future Appointments  Date Time Provider Department Center  10/07/2024  8:15 AM [facility] PROVIDER 1 [facility]  10/07/2024  8:30 AM [facility] US2 [facility]    Olam Boards, CNM  "

## 2024-09-30 NOTE — Progress Notes (Signed)
 Pt presents for ROB visit. C/o back pain and nose bleeds 1-2 times per day.

## 2024-10-01 ENCOUNTER — Ambulatory Visit: Admitting: Physical Therapy

## 2024-10-03 ENCOUNTER — Ambulatory Visit

## 2024-10-06 ENCOUNTER — Ambulatory Visit: Admitting: Physical Therapy

## 2024-10-07 ENCOUNTER — Encounter: Payer: Self-pay | Admitting: Maternal & Fetal Medicine

## 2024-10-07 ENCOUNTER — Ambulatory Visit: Attending: Obstetrics and Gynecology | Admitting: Maternal & Fetal Medicine

## 2024-10-07 ENCOUNTER — Ambulatory Visit

## 2024-10-07 ENCOUNTER — Other Ambulatory Visit: Payer: Self-pay | Admitting: *Deleted

## 2024-10-07 VITALS — BP 128/74

## 2024-10-07 DIAGNOSIS — O358XX0 Maternal care for other (suspected) fetal abnormality and damage, not applicable or unspecified: Secondary | ICD-10-CM | POA: Insufficient documentation

## 2024-10-07 DIAGNOSIS — O09893 Supervision of other high risk pregnancies, third trimester: Secondary | ICD-10-CM | POA: Insufficient documentation

## 2024-10-07 DIAGNOSIS — Z3483 Encounter for supervision of other normal pregnancy, third trimester: Secondary | ICD-10-CM

## 2024-10-07 DIAGNOSIS — Z3480 Encounter for supervision of other normal pregnancy, unspecified trimester: Secondary | ICD-10-CM

## 2024-10-07 DIAGNOSIS — Z362 Encounter for other antenatal screening follow-up: Secondary | ICD-10-CM

## 2024-10-07 DIAGNOSIS — Z3A33 33 weeks gestation of pregnancy: Secondary | ICD-10-CM

## 2024-10-07 DIAGNOSIS — Z3689 Encounter for other specified antenatal screening: Secondary | ICD-10-CM | POA: Diagnosis present

## 2024-10-07 NOTE — Progress Notes (Signed)
" ° °  Patient information  Patient Name: Karen Koch  Patient MRN:   [id number]  Referring practice: [facility] Referring Provider: [facility]  Problem List   Patient Active Problem List   Diagnosis Date Noted   High risk teen pregnancy 06/25/2024   UTI due to Klebsiella species 05/12/2024   Excessive salivation while pregnant 05/12/2024   Nausea and vomiting during pregnancy 05/12/2024   Positive depression screening 05/12/2024   Supervision of other normal pregnancy, antepartum 04/14/2024    Maternal Fetal medicine Consult  [name] is a 19 y.o. G1P0000 at 102w4d here for ultrasound and consultation. Karen Koch is doing well today with no acute concerns. Today we focused on the following:   The patient is here for a follow-up growth ultrasound due to a teen pregnancy. She has had normal fetal biometry on prior examinations. On todays ultrasound, fetal growth and anatomy appear normal. There are no current indications for additional ultrasounds. We discussed potential future indications for ultrasound evaluation, including hypertensive disorders, decreased fetal movement, or a size-date discrepancy based on fundal height measurements. At this Koch, no further ultrasounds are warranted, and she will continue routine prenatal care with her primary provider. Fetal movement precautions were reviewed.  RECOMMENDATIONS -Continue routine prenatal care with primary OB provider -No additional ultrasounds indicated at this Koch -Obtain ultrasound evaluation if new clinical concerns arise (e.g., elevated blood pressure, decreased fetal movement, or fundal height discrepancy) -Continue daily fetal movement awareness and follow fetal movement precautions The patient had Koch to ask questions that were answered to her satisfaction.  She verbalized understanding and agrees to proceed with the plan above.   I spent 20 minutes reviewing the patients chart, including labs and images as well as  counseling the patient about her medical conditions. Karen Koch was spent in direct face-to-face patient counseling.  [name]  MFM,    10/07/2024  11:39 AM   Review of Systems: A review of systems was performed and was negative except per HPI   Vitals and Physical Exam    10/07/2024    8:22 AM 09/30/2024    2:40 PM 09/23/2024   11:44 AM  Vitals with BMI  Weight  158 lbs   BMI  24.74   Systolic 128 117 890  Diastolic 74 69 61  Pulse  86 100    Sitting comfortably on the sonogram table Nonlabored breathing Normal rate and rhythm Abdomen is nontender  Past pregnancies OB History  Gravida Para Term Preterm AB Living  1 0 0 0 0 0  SAB IAB Ectopic Multiple Live Births  0 0 0 0 0    # Outcome Date GA Lbr Len/2nd Weight Sex Type Anes PTL Lv  1 Current              Future Appointments  Date Koch Provider Department Center  10/15/2024  8:55 AM Delores Nidia CROME, FNP [facility] None      "

## 2024-10-08 ENCOUNTER — Ambulatory Visit: Admitting: Physical Therapy

## 2024-10-10 ENCOUNTER — Other Ambulatory Visit: Payer: Self-pay

## 2024-10-10 ENCOUNTER — Inpatient Hospital Stay (HOSPITAL_COMMUNITY): Admitting: Anesthesiology

## 2024-10-10 ENCOUNTER — Encounter (HOSPITAL_COMMUNITY): Payer: Self-pay | Admitting: Obstetrics & Gynecology

## 2024-10-10 ENCOUNTER — Encounter (HOSPITAL_COMMUNITY): Payer: Self-pay

## 2024-10-10 ENCOUNTER — Encounter (HOSPITAL_COMMUNITY)
Admission: AD | Disposition: A | Discharge status: Home / Self Care | Payer: Self-pay | Source: Home / Self Care | Attending: Obstetrics & Gynecology

## 2024-10-10 ENCOUNTER — Inpatient Hospital Stay (HOSPITAL_COMMUNITY)
Admission: AD | Admit: 2024-10-10 | Discharge: 2024-10-13 | DRG: 787 | Disposition: A | Discharge status: Home / Self Care | Attending: Obstetrics & Gynecology | Admitting: Obstetrics & Gynecology

## 2024-10-10 DIAGNOSIS — O4590 Premature separation of placenta, unspecified, unspecified trimester: Principal | ICD-10-CM | POA: Diagnosis present

## 2024-10-10 DIAGNOSIS — Z833 Family history of diabetes mellitus: Secondary | ICD-10-CM

## 2024-10-10 DIAGNOSIS — D62 Acute posthemorrhagic anemia: Secondary | ICD-10-CM | POA: Diagnosis not present

## 2024-10-10 DIAGNOSIS — Z3A34 34 weeks gestation of pregnancy: Secondary | ICD-10-CM

## 2024-10-10 DIAGNOSIS — O9902 Anemia complicating childbirth: Secondary | ICD-10-CM | POA: Diagnosis present

## 2024-10-10 DIAGNOSIS — O4593 Premature separation of placenta, unspecified, third trimester: Principal | ICD-10-CM | POA: Diagnosis present

## 2024-10-10 LAB — COMPREHENSIVE METABOLIC PANEL WITH GFR
ALT: 28 U/L (ref 0–44)
AST: 30 U/L (ref 15–41)
Albumin: 3.2 g/dL — ABNORMAL LOW (ref 3.5–5.0)
Alkaline Phosphatase: 135 U/L — ABNORMAL HIGH (ref 38–126)
Anion gap: 13 (ref 5–15)
BUN: 7 mg/dL (ref 6–20)
CO2: 19 mmol/L — ABNORMAL LOW (ref 22–32)
Calcium: 8.6 mg/dL — ABNORMAL LOW (ref 8.9–10.3)
Chloride: 102 mmol/L (ref 98–111)
Creatinine, Ser: 0.44 mg/dL (ref 0.44–1.00)
GFR, Estimated: >60 mL/min
Glucose, Bld: 107 mg/dL — ABNORMAL HIGH (ref 70–99)
Potassium: 3.3 mmol/L — ABNORMAL LOW (ref 3.5–5.1)
Sodium: 133 mmol/L — ABNORMAL LOW (ref 135–145)
Total Bilirubin: 0.3 mg/dL (ref 0.0–1.2)
Total Protein: 6.1 g/dL — ABNORMAL LOW (ref 6.5–8.1)

## 2024-10-10 LAB — CBC
HCT: 27.5 % — ABNORMAL LOW (ref 36.0–46.0)
Hemoglobin: 9.2 g/dL — ABNORMAL LOW (ref 12.0–15.0)
MCH: 29.6 pg (ref 26.0–34.0)
MCHC: 33.5 g/dL (ref 30.0–36.0)
MCV: 88.4 fL (ref 80.0–100.0)
Platelets: 295 K/uL (ref 150–400)
RBC: 3.11 MIL/uL — ABNORMAL LOW (ref 3.87–5.11)
RDW: 12.3 % (ref 11.5–15.5)
WBC: 15.6 K/uL — ABNORMAL HIGH (ref 4.0–10.5)
nRBC: 0 % (ref 0.0–0.2)

## 2024-10-10 MED ORDER — OXYTOCIN-SODIUM CHLORIDE 30-0.9 UT/500ML-% IV SOLN
INTRAVENOUS | Status: DC | PRN
  Administered 2024-10-10: 200 mL via INTRAVENOUS

## 2024-10-10 MED ORDER — FENTANYL CITRATE (PF) 100 MCG/2ML IJ SOLN
INTRAMUSCULAR | Status: DC | PRN
  Administered 2024-10-10: 100 ug via INTRAVENOUS

## 2024-10-10 MED ORDER — SODIUM CHLORIDE 0.9 % IR SOLN
Status: DC | PRN
  Administered 2024-10-10: 1

## 2024-10-10 MED ORDER — GABAPENTIN 300 MG PO CAPS
300.0000 mg | ORAL_CAPSULE | Freq: Three times a day (TID) | ORAL | Status: DC
  Administered 2024-10-10 – 2024-10-13 (×10): 300 mg via ORAL
  Filled 2024-10-10 (×10): qty 1

## 2024-10-10 MED ORDER — DEXAMETHASONE SOD PHOSPHATE PF 10 MG/ML IJ SOLN
INTRAMUSCULAR | Status: DC | PRN
  Administered 2024-10-10: 5 mg via INTRAVENOUS

## 2024-10-10 MED ORDER — BUPIVACAINE HCL (PF) 0.5 % IJ SOLN
INTRAMUSCULAR | Status: AC
  Filled 2024-10-10: qty 30

## 2024-10-10 MED ORDER — NALOXONE HCL 0.4 MG/ML IJ SOLN: 0.4000 mg | INTRAMUSCULAR | Status: DC | PRN

## 2024-10-10 MED ORDER — ACETAMINOPHEN 10 MG/ML IV SOLN
INTRAVENOUS | Status: DC | PRN
  Administered 2024-10-10: 1000 mg via INTRAVENOUS

## 2024-10-10 MED ORDER — SENNOSIDES-DOCUSATE SODIUM 8.6-50 MG PO TABS
2.0000 | ORAL_TABLET | Freq: Every day | ORAL | Status: DC
  Administered 2024-10-11 – 2024-10-13 (×3): 2 via ORAL
  Filled 2024-10-10 (×3): qty 2

## 2024-10-10 MED ORDER — COCONUT OIL OIL: 1.0000 | TOPICAL_OIL | Status: DC | PRN

## 2024-10-10 MED ORDER — HYDROMORPHONE 1 MG/ML IV SOLN
INTRAVENOUS | Status: DC
  Administered 2024-10-10: 30 mg via INTRAVENOUS
  Filled 2024-10-10: qty 30

## 2024-10-10 MED ORDER — SUCCINYLCHOLINE CHLORIDE 200 MG/10ML IV SOSY
PREFILLED_SYRINGE | INTRAVENOUS | Status: DC | PRN
  Administered 2024-10-10: 100 mg via INTRAVENOUS

## 2024-10-10 MED ORDER — KETOROLAC TROMETHAMINE 30 MG/ML IJ SOLN
30.0000 mg | Freq: Once | INTRAMUSCULAR | Status: AC
  Administered 2024-10-10: 30 mg via INTRAVENOUS

## 2024-10-10 MED ORDER — HYDROMORPHONE HCL 1 MG/ML IJ SOLN
INTRAMUSCULAR | Status: AC
  Filled 2024-10-10: qty 1

## 2024-10-10 MED ORDER — DIPHENHYDRAMINE HCL 25 MG PO CAPS
25.0000 mg | ORAL_CAPSULE | Freq: Four times a day (QID) | ORAL | Status: DC | PRN
  Administered 2024-10-11: 25 mg via ORAL
  Filled 2024-10-10: qty 1

## 2024-10-10 MED ORDER — CEFAZOLIN SODIUM-DEXTROSE 2-3 GM-%(50ML) IV SOLR
INTRAVENOUS | Status: DC | PRN
  Administered 2024-10-10: 2 g via INTRAVENOUS

## 2024-10-10 MED ORDER — PRENATAL MULTIVITAMIN CH
1.0000 | ORAL_TABLET | Freq: Every day | ORAL | Status: DC
  Administered 2024-10-10 – 2024-10-12 (×3): 1 via ORAL
  Filled 2024-10-10 (×3): qty 1

## 2024-10-10 MED ORDER — LACTATED RINGERS IV SOLN: INTRAVENOUS | Status: DC

## 2024-10-10 MED ORDER — DROPERIDOL 2.5 MG/ML IJ SOLN: 0.6250 mg | Freq: Once | INTRAMUSCULAR | Status: DC | PRN

## 2024-10-10 MED ORDER — ENOXAPARIN SODIUM 40 MG/0.4ML IJ SOSY
40.0000 mg | PREFILLED_SYRINGE | INTRAMUSCULAR | Status: DC
  Administered 2024-10-10 – 2024-10-12 (×3): 40 mg via SUBCUTANEOUS
  Filled 2024-10-10 (×3): qty 0.4

## 2024-10-10 MED ORDER — MEASLES, MUMPS & RUBELLA VAC ~~LOC~~ SUSR: 0.5000 mL | Freq: Once | SUBCUTANEOUS | Status: DC

## 2024-10-10 MED ORDER — KETOROLAC TROMETHAMINE 30 MG/ML IJ SOLN
INTRAMUSCULAR | Status: AC
  Filled 2024-10-10: qty 1

## 2024-10-10 MED ORDER — OXYCODONE HCL 5 MG PO TABS
5.0000 mg | ORAL_TABLET | ORAL | Status: DC | PRN
  Administered 2024-10-10 – 2024-10-13 (×11): 10 mg via ORAL
  Filled 2024-10-10 (×11): qty 2

## 2024-10-10 MED ORDER — SIMETHICONE 80 MG PO CHEW
80.0000 mg | CHEWABLE_TABLET | Freq: Three times a day (TID) | ORAL | Status: DC
  Administered 2024-10-10 – 2024-10-13 (×9): 80 mg via ORAL
  Filled 2024-10-10 (×9): qty 1

## 2024-10-10 MED ORDER — WITCH HAZEL-GLYCERIN EX PADS: 1.0000 | MEDICATED_PAD | CUTANEOUS | Status: DC | PRN

## 2024-10-10 MED ORDER — OXYTOCIN-SODIUM CHLORIDE 30-0.9 UT/500ML-% IV SOLN: 2.5000 [IU]/h | INTRAVENOUS | Status: AC

## 2024-10-10 MED ORDER — ONDANSETRON HCL 4 MG/2ML IJ SOLN: 4.0000 mg | Freq: Four times a day (QID) | INTRAMUSCULAR | Status: DC | PRN

## 2024-10-10 MED ORDER — IBUPROFEN 600 MG PO TABS
600.0000 mg | ORAL_TABLET | Freq: Four times a day (QID) | ORAL | Status: DC
  Administered 2024-10-11 – 2024-10-13 (×8): 600 mg via ORAL
  Filled 2024-10-10 (×8): qty 1

## 2024-10-10 MED ORDER — HYDROMORPHONE HCL 1 MG/ML IJ SOLN
0.2500 mg | INTRAMUSCULAR | Status: DC | PRN
  Administered 2024-10-10 (×3): 0.5 mg via INTRAVENOUS

## 2024-10-10 MED ORDER — HYDROMORPHONE HCL 1 MG/ML IJ SOLN
INTRAMUSCULAR | Status: AC
  Filled 2024-10-10: qty 0.5

## 2024-10-10 MED ORDER — DIBUCAINE (PERIANAL) 1 % EX OINT: 1.0000 | TOPICAL_OINTMENT | CUTANEOUS | Status: DC | PRN

## 2024-10-10 MED ORDER — MEDROXYPROGESTERONE ACETATE 150 MG/ML IM SUSP: 150.0000 mg | INTRAMUSCULAR | Status: DC | PRN

## 2024-10-10 MED ORDER — MENTHOL 3 MG MT LOZG: 1.0000 | LOZENGE | OROMUCOSAL | Status: DC | PRN

## 2024-10-10 MED ORDER — TETANUS-DIPHTH-ACELL PERTUSSIS 5-2-15.5 LF-MCG/0.5 IM SUSP: 0.5000 mL | Freq: Once | INTRAMUSCULAR | Status: DC

## 2024-10-10 MED ORDER — FENTANYL CITRATE (PF) 250 MCG/5ML IJ SOLN
INTRAMUSCULAR | Status: AC
  Filled 2024-10-10: qty 5

## 2024-10-10 MED ORDER — PHENYLEPHRINE 80 MCG/ML (10ML) SYRINGE FOR IV PUSH (FOR BLOOD PRESSURE SUPPORT)
PREFILLED_SYRINGE | INTRAVENOUS | Status: DC | PRN
  Administered 2024-10-10 (×2): 80 ug via INTRAVENOUS
  Administered 2024-10-10: 160 ug via INTRAVENOUS
  Administered 2024-10-10: 80 ug via INTRAVENOUS
  Administered 2024-10-10: 160 ug via INTRAVENOUS

## 2024-10-10 MED ORDER — LACTATED RINGERS IV SOLN: INTRAVENOUS | Status: DC | PRN

## 2024-10-10 MED ORDER — SIMETHICONE 80 MG PO CHEW
80.0000 mg | CHEWABLE_TABLET | ORAL | Status: DC | PRN
  Administered 2024-10-10: 80 mg via ORAL
  Filled 2024-10-10: qty 1

## 2024-10-10 MED ORDER — ACETAMINOPHEN 325 MG PO TABS
650.0000 mg | ORAL_TABLET | ORAL | Status: DC | PRN
  Administered 2024-10-11: 650 mg via ORAL
  Filled 2024-10-10 (×2): qty 2

## 2024-10-10 MED ORDER — ONDANSETRON HCL 4 MG/2ML IJ SOLN
INTRAMUSCULAR | Status: DC | PRN
  Administered 2024-10-10: 4 mg via INTRAVENOUS

## 2024-10-10 MED ORDER — PROPOFOL 10 MG/ML IV BOLUS
INTRAVENOUS | Status: DC | PRN
  Administered 2024-10-10: 150 mg via INTRAVENOUS

## 2024-10-10 MED ORDER — KETOROLAC TROMETHAMINE 30 MG/ML IJ SOLN
30.0000 mg | Freq: Four times a day (QID) | INTRAMUSCULAR | Status: AC
  Administered 2024-10-10 – 2024-10-11 (×4): 30 mg via INTRAVENOUS
  Filled 2024-10-10 (×4): qty 1

## 2024-10-10 MED ORDER — SODIUM CHLORIDE 0.9% FLUSH: 9.0000 mL | INTRAVENOUS | Status: DC | PRN

## 2024-10-10 NOTE — MAU Note (Addendum)
 Pt here by EMS with heavy VB and severe abdominal pain. Pt states she has not felt baby move.  [place], NP at bedside  EFM placed - FHR 70-75  SVE: 7.5 / 100 / -1 by Littie, NP  Ozan, MD called to bedside and CODE CESAREAN called.  IV fluid bolus started.  Pt transported to OB OR at [phone]

## 2024-10-10 NOTE — Lactation Note (Signed)
 This note was copied from a baby's chart.  NICU Lactation Consultation Note  Patient Name: Karen Koch Date: 10/10/2024 Age:20 hours  Reason for consult: Initial assessment; Primapara; 1st time breastfeeding; Other (Comment); Late-preterm 34-36.6wks; Infant < 6lbs (teen pregnancy)  SUBJECTIVE Visited with family of 71 40/21 weeks old NICU female; baby Karen Koch got admitted due to prematurity. Karen Koch is a P1 and reported she's already pumping and got enough colostrum to collect on her first pumping session. Explained the normalcy patterns at this many hours post-partum and let her know that the purpose of pumping this early on is mainly for breast stimulation and not to expect volume every single time, praised her for all her efforts.  Assisted with breast massage, hand expression and resized her flanges. Reviewed pumping schedule, pumping log, secretory activation, lactogenesis III, CDC and anticipatory guidelines.   OBJECTIVE Infant data: Mother's Current Feeding Choice: -- (NPO)  O2 Device: Room Air FiO2 (%): 21 %  Infant feeding assessment IDFS - Readiness: 3   Maternal data: G1P0101 C-Section, Low Transverse Has patient been taught Hand Expression?: Yes Hand Expression Comments: no colostrum noted at this time Significant Breast History:: (+++) during the pregnancy Current breast feeding challenges:: NICU admission Does the patient have breastfeeding experience prior to this delivery?: No Pumping frequency: initiated pumping at 5 hours post-partum Pumped volume: 3 mL Flange Size: 21 Risk factor for low/delayed milk supply:: C/S, primipara, prematurity, < 6 lbs, infant separation  Pump: Referral sent for Stork Pump  ASSESSMENT Infant: Feeding Status: NPO  Maternal: Milk volume: Normal  INTERVENTIONS/PLAN Interventions: Interventions: Breast feeding basics reviewed; Breast massage; Hand express; Coconut oil; DEBP; Education; [employer] Services brochure; CDC  milk storage guidelines; CDC Guidelines for Breast Pump Cleaning; NICU Pumping Log Tools: Pump; Flanges; Coconut oil Pump Education: Setup, frequency, and cleaning; Milk Storage  Plan: STS around care times Breast massage, hand expression and coconut oil prior to pumping Pump both breasts on initiate mode every 3 hours for 15 minutes; ideally 8 pumping sessions/24 hours Verify Stork pump issuance  FOB and MGM present and supportive. All questions and concerns answered, family to contact [facility] services PRN.  Consult Status: NICU follow-up NICU Follow-up type: New admission follow up   Karen Koch 10/10/2024, 1:06 PM

## 2024-10-10 NOTE — Op Note (Signed)
 Karen Koch PROCEDURE DATE: 10/10/2024  PREOPERATIVE DIAGNOSES: Intrauterine pregnancy at [redacted]w[redacted]d weeks gestation; abruptio placenta, Fetal bradycardia  POSTOPERATIVE DIAGNOSES: The same  PROCEDURE: Primary Low Transverse Cesarean Section  SURGEON:  Delon CHRISTELLA Prude   ANESTHESIOLOGY TEAM: Anesthesiologist: Paul Lamarr BRAVO, MD CRNA: Ivonne Olam NOVAK, CRNA; Lansing Hildegard NOVAK, CRNA   FINDINGS:  Viable female infant in cephalic presentation.  Apgars APGAR (1 MIN):  7 APGAR (5 MINS):  8 [place] appearing blood noted with immediate entry into the uterus.  Intact placenta, three vessel cord.  Normal uterus, fallopian tubes and ovaries bilaterally.  ANESTHESIA: General  INTRAVENOUS FLUIDS: 1000 ml   ESTIMATED BLOOD LOSS: 140 ml URINE OUTPUT:  50 ml SPECIMENS: Placenta sent to pathology COMPLICATIONS: None immediate  PROCEDURE IN DETAIL:  The patient preoperatively received intravenous antibiotics and had sequential compression devices applied to her lower extremities.  She was then taken to the operating room where she was placed in a dorsal supine position with a leftward tilt, and prepped and draped in a sterile manner.  A foley catheter was placed into her bladder and attached to constant gravity.  General anesthesia was administered.  A Pfannenstiel skin incision was made with scalpel  carried through to the underlying layer of fascia. The fascia was incised in the midline, and this incision was extended bilaterally using the Mayo scissors.  The rectus muscles were separated in the midline and the peritoneum was entered bluntly.  Attention was turned to the lower uterine segment where a low transverse hysterotomy was made with a scalpel and extended bilaterally bluntly.  The infant was successfully delivered in the typical fashion. Cord blood was collected.  The cord was clamped and cut after and the infant was handed over to the awaiting neonatology team. The Alexis retractor was then placed.   Uterine massage was then administered, and the placenta delivered intact with a three-vessel cord. The uterus was then cleared of clots and debris.  The uterus was brought through the incision.  The hysterotomy was closed with 0 Vicryl in a running locked fashion.  Excellent hemostasis was noted.  The uterus was returned to the abdomen.  The pelvis was cleared of all clot and debris. Hemostasis was confirmed on all surfaces.  The retractor was removed.  The peritoneum was closed with a 0 Vicryl running stitch.  The fascia was then closed using 0 Vicryl in a running fashion.  20cc of bupivacaine  was injected into the fascia.  The skin was closed with a 4-0 Vicryl subcuticular stitch. The patient tolerated the procedure well. Sponge, instrument and needle counts were correct x 3.  She was taken to the recovery room in stable condition.   Karen Koch M Karen Sunderland, DO [facility] [employer]

## 2024-10-10 NOTE — Discharge Summary (Signed)
" ° °    Postpartum Discharge Summary  Date of Service updated***     Patient Name: Karen Koch DOB: [birth year] MRN: [id number]  Date of admission: 10/10/2024 Delivery date:10/10/2024 Delivering provider: MARILYNN NEST Date of discharge: 10/10/2024  Admitting diagnosis: bleeding ctx Intrauterine pregnancy: [redacted]w[redacted]d     Secondary diagnosis:  Placenta abruption Additional problems: ***    Discharge diagnosis: {DX.:23714}                                              Post partum procedures:{Postpartum procedures:23558} Augmentation: N/A Complications: {OB Labor/Delivery Complications:20784}  Hospital course:   19 y.o. yo G1P0101 at [redacted]w[redacted]d was admitted due to vaginal bleeding/abdominal pain.  Stat C-section from MAU due to concern for placenta abruption @ 34wks. Delivery details as follows: Membrane Rupture Time/Date: 5:10 AM,10/10/2024  Delivery Method:C-Section, Low Transverse Operative Delivery:N/A Details of operation can be found in separate operative note. Patient had a postpartum course complicated by***.  She is ambulating,tolerating a regular diet, passing flatus, and urinating well.  Patient is discharged home in stable condition 10/10/24.  Newborn Data: Birth date:10/10/2024 Birth time:5:11 AM Gender:Female Living status:Living Apgars: ,  Weight:2510 g  Magnesium Sulfate received: {Mag received:30440022} BMZ received: {BMZ received:30440023} Rhophylac:{Rhophylac received:30440032} FFM:{FFM:69559966} T-DaP:{Tdap:23962} Flu: {Qol:76036} RSV Vaccine received: {RSV:31013} Transfusion:{Transfusion received:30440034}  Immunizations received: Immunization History  Administered Date(s) Administered   Influenza, Seasonal, Injecte, Preservative Fre 09/01/2024   PFIZER Comirnaty(Gray Top)Covid-19 Tri-Sucrose Vaccine 12/24/2020, 01/19/2021   Tdap 09/01/2024    Physical exam  Vitals:   10/10/24 0608 10/10/24 0609 10/10/24 0610 10/10/24 0611  BP:      Pulse: 79 83 77 79  Resp:  (!) 24 18 (!) 21 (!) 22  Temp:      TempSrc:      SpO2: 100% 100% 100% 100%   General: {Exam; general:21111117} Lochia: {Desc; appropriate/inappropriate:30686::appropriate} Uterine Fundus: {Desc; firm/soft:30687} Incision: {Exam; incision:21111123} DVT Evaluation: {Exam; dvt:2111122} Labs: Lab Results  Component Value Date   WBC 5.5 09/23/2024   HGB 10.5 (L) 09/23/2024   HCT 31.4 (L) 09/23/2024   MCV 90.0 09/23/2024   PLT 199 09/23/2024       No data to display         Edinburgh Score:     No data to display         No data recorded  After visit meds:  Allergies as of 10/10/2024   No Known Allergies   Med Rec must be completed prior to using this [facility]***        Discharge home in stable condition Infant Feeding: {Baby feeding:23562} Infant Disposition:{CHL IP OB HOME WITH FNUYZM:76418} Discharge instruction: per After Visit Summary and Postpartum booklet. Activity: Advance as tolerated. Pelvic rest for 6 weeks.  Diet: {OB ipzu:78888878} Future Appointments: Future Appointments  Date Time Provider Department Center  10/15/2024  8:55 AM Delores Nidia CROME, FNP [facility] None   Follow up Visit:   Please schedule this patient for a In person postpartum visit in 1 week with the following provider: Any provider. Additional Postpartum F/U:Incision check 1 week  Low risk pregnancy complicated by: placenta abruption Delivery mode:  C-Section, Low Transverse Anticipated Birth Control:  {Birth Control:23956}  Note sent 1/16  10/10/2024 Jennifer M Ozan, DO    "

## 2024-10-10 NOTE — Anesthesia Preprocedure Evaluation (Signed)
"                                    Anesthesia Evaluation  Patient identified by MRN, date of birth, ID band Patient awake    Reviewed: Unable to perform ROS - Chart review onlyPreop documentation limited or incomplete due to emergent nature of procedure.  History of Anesthesia Complications Negative for: history of anesthetic complications  Airway Mallampati: II  TM Distance: >3 FB Neck ROM: Full    Dental no notable dental hx.    Pulmonary neg pulmonary ROS   Pulmonary exam normal        Cardiovascular negative cardio ROS Normal cardiovascular exam     Neuro/Psych negative neurological ROS     GI/Hepatic negative GI ROS, Neg liver ROS,,,  Endo/Other  negative endocrine ROS    Renal/GU negative Renal ROS     Musculoskeletal negative musculoskeletal ROS (+)    Abdominal   Peds  Hematology negative hematology ROS (+)   Anesthesia Other Findings   Reproductive/Obstetrics (+) Pregnancy                              Anesthesia Physical Anesthesia Plan  ASA: 2 and emergent  Anesthesia Plan: General   Post-op Pain Management: Ofirmev  IV (intra-op)*   Induction: Intravenous, Rapid sequence and Cricoid pressure planned  PONV Risk Score and Plan: 3 and Treatment may vary due to age or medical condition, Ondansetron  and Dexamethasone   Airway Management Planned: Oral ETT and Video Laryngoscope Planned  Additional Equipment:   Intra-op Plan:   Post-operative Plan: Extubation in OR  Informed Consent: I have reviewed the patients History and Physical, chart, labs and discussed the procedure including the risks, benefits and alternatives for the proposed anesthesia with the patient or authorized representative who has indicated his/her understanding and acceptance.     Only emergency history available  Plan Discussed with: CRNA  Anesthesia Plan Comments: (Code Cesarean from MAU for placental abruption. )          Anesthesia Quick Evaluation  "

## 2024-10-10 NOTE — Progress Notes (Signed)
 At bedside due to concern for placenta abruption @ 34wks.  Vaginal BRB noted that had soaked bed sheets, pt writhing in significant pelvic/abdominal pain.  Difficulty obtaining FHR- suspected to be ~ 70bpm.  Pt was verbally consented for stat C-section.  OR notified.  See Op Note for further details  Terita Hejl, DO Attending Obstetrician & Gynecologist, [facility] for [employer], [facility]

## 2024-10-10 NOTE — Anesthesia Procedure Notes (Signed)
 Procedure Name: Intubation Date/Time: 10/10/2024 5:09 AM  Performed by: Paul Lamarr BRAVO, MDPre-anesthesia Checklist: Patient identified, Emergency Drugs available, Suction available and Patient being monitored Patient Re-evaluated:Patient Re-evaluated prior to induction Oxygen Delivery Method: Circle System Utilized Preoxygenation: Pre-oxygenation with 100% oxygen Induction Type: IV induction and Rapid sequence Laryngoscope Size: Glidescope and 3 Grade View: Grade I Tube type: Oral Tube size: 7.0 mm Number of attempts: 1 Airway Equipment and Method: Stylet and Video-laryngoscopy Placement Confirmation: ETT inserted through vocal cords under direct vision, positive ETCO2 and breath sounds checked- equal and bilateral Secured at: 23 cm Tube secured with: Tape Dental Injury: Teeth and Oropharynx as per pre-operative assessment  Comments: Intubation by Dr. Paul

## 2024-10-10 NOTE — Plan of Care (Signed)
" °  Problem: Education: Goal: Knowledge of General Education information will improve Description: Including pain rating scale, medication(s)/side effects and non-pharmacologic comfort measures Outcome: Progressing   Problem: Health Behavior/Discharge Planning: Goal: Ability to manage health-related needs will improve Outcome: Progressing   Problem: Clinical Measurements: Goal: Ability to maintain clinical measurements within normal limits will improve Outcome: Progressing Goal: Will remain free from infection Outcome: Progressing Goal: Diagnostic test results will improve Outcome: Progressing Goal: Respiratory complications will improve Outcome: Progressing Goal: Cardiovascular complication will be avoided Outcome: Progressing   Problem: Activity: Goal: Risk for activity intolerance will decrease Outcome: Progressing   Problem: Nutrition: Goal: Adequate nutrition will be maintained Outcome: Progressing   Problem: Coping: Goal: Level of anxiety will decrease Outcome: Progressing   Problem: Elimination: Goal: Will not experience complications related to bowel motility Outcome: Progressing Goal: Will not experience complications related to urinary retention Outcome: Progressing   Problem: Pain Managment: Goal: General experience of comfort will improve and/or be controlled Outcome: Progressing   Problem: Safety: Goal: Ability to remain free from injury will improve Outcome: Progressing   Problem: Skin Integrity: Goal: Risk for impaired skin integrity will decrease Outcome: Progressing   Problem: Education: Goal: Knowledge of the prescribed therapeutic regimen will improve Outcome: Progressing   Problem: Bowel/Gastric: Goal: Gastrointestinal status for postoperative course will improve Outcome: Progressing   Problem: Cardiac: Goal: Ability to maintain an adequate cardiac output Outcome: Progressing Goal: Will show no evidence of cardiac arrhythmias Outcome:  Progressing   Problem: Nutritional: Goal: Will attain and maintain optimal nutritional status Outcome: Progressing   Problem: Neurological: Goal: Will regain or maintain usual level of consciousness Outcome: Progressing   Problem: Clinical Measurements: Goal: Ability to maintain clinical measurements within normal limits Outcome: Progressing Goal: Postoperative complications will be avoided or minimized Outcome: Progressing   Problem: Respiratory: Goal: Will regain and/or maintain adequate ventilation Outcome: Progressing Goal: Respiratory status will improve Outcome: Progressing   Problem: Skin Integrity: Goal: Demonstrates signs of wound healing without infection Outcome: Progressing   Problem: Urinary Elimination: Goal: Will remain free from infection Outcome: Progressing Goal: Ability to achieve and maintain adequate urine output Outcome: Progressing   Problem: Education: Goal: Knowledge of the prescribed therapeutic regimen will improve Outcome: Progressing Goal: Understanding of sexual limitations or changes related to disease process or condition will improve Outcome: Progressing Goal: Individualized Educational Video(s) Outcome: Progressing   Problem: Self-Concept: Goal: Communication of feelings regarding changes in body function or appearance will improve Outcome: Progressing   Problem: Skin Integrity: Goal: Demonstration of wound healing without infection will improve Outcome: Progressing   Problem: Education: Goal: Knowledge of condition will improve Outcome: Progressing Goal: Individualized Educational Video(s) Outcome: Progressing Goal: Individualized Newborn Educational Video(s) Outcome: Progressing   Problem: Activity: Goal: Will verbalize the importance of balancing activity with adequate rest periods Outcome: Progressing Goal: Ability to tolerate increased activity will improve Outcome: Progressing   Problem: Coping: Goal: Ability to  identify and utilize available resources and services will improve Outcome: Progressing   Problem: Life Cycle: Goal: Chance of risk for complications during the postpartum period will decrease Outcome: Progressing   Problem: Role Relationship: Goal: Ability to demonstrate positive interaction with newborn will improve Outcome: Progressing   Problem: Skin Integrity: Goal: Demonstration of wound healing without infection will improve Outcome: Progressing   "

## 2024-10-10 NOTE — Anesthesia Postprocedure Evaluation (Signed)
"   Anesthesia Post Note  Patient: Karen Koch  Procedure(s) Performed: CESAREAN DELIVERY     Patient location during evaluation: PACU Anesthesia Type: General Level of consciousness: awake and alert Pain management: pain level controlled Vital Signs Assessment: post-procedure vital signs reviewed and stable Respiratory status: spontaneous breathing, nonlabored ventilation and respiratory function stable Cardiovascular status: blood pressure returned to baseline Postop Assessment: no apparent nausea or vomiting Anesthetic complications: no   No notable events documented.  Last Vitals:  Vitals:   10/10/24 0649 10/10/24 0700  BP:  122/73  Pulse: 86 81  Resp: (!) 21 16  Temp:  (P) 36.4 C  SpO2: 100% 100%    Last Pain:  Vitals:   10/10/24 0713  TempSrc:   PainSc: 8    Pain Goal:    LLE Motor Response: Purposeful movement (10/10/24 0645) LLE Sensation: Full sensation (10/10/24 0645) RLE Motor Response: Purposeful movement (10/10/24 0645) RLE Sensation: Full sensation (10/10/24 0645)        Vertell Row      "

## 2024-10-10 NOTE — MAU Provider Note (Signed)
 Received  code from EMS at approximately 4446 with a 19 year old coming in from home with heavy vaginal bleeding with clots, severe abdominal pain rating it 10/ 10 on pain scale and decreased to no fetal movement.    Dr. Ozan  MAU attending called to open OR at 0450 for potential placental abruption.    RNs at the bedside IV established and LR Fluids infusing .    When EMS arrived EFM placed by RN - fetal heart rate 70-75  Severe abdominal pain with heavy vaginal bleeding visually observed and patient visually syncopal   Abdomen palpated and hardened with ctx q 1 min/EMS report   SVE by me 7.5/100/-1   Stat code cesarean called by Dr Ozan and patient transferred ported by myself and RN's  [facility] RN to the OR at [phone] via stretcher   Anesthesia at the bedside with consent and attending scrubbed and ready in the OR. -------------------------------------------- Karen Dalton, MSN, [facility], Center for [facility]

## 2024-10-10 NOTE — Transfer of Care (Signed)
 Immediate Anesthesia Transfer of Care Note  Patient: Karen Koch  Procedure(s) Performed: CESAREAN DELIVERY  Patient Location: PACU  Anesthesia Type:General  Level of Consciousness: drowsy and patient cooperative  Airway & Oxygen Therapy: Patient Spontanous Breathing  Post-op Assessment: Report given to RN, Post -op Vital signs reviewed and stable, and Patient moving all extremities X 4  Post vital signs: Reviewed and stable  Last Vitals:  Vitals Value Taken Time  BP 117/76 10/10/24 06:00  Temp 36.7 C 10/10/24 06:00  Pulse 77 10/10/24 06:04  Resp 21 10/10/24 06:04  SpO2 100 % 10/10/24 06:04  Vitals shown include unfiled device data.  Last Pain:  Vitals:   10/10/24 0600  TempSrc: Axillary         Complications: No notable events documented.

## 2024-10-11 ENCOUNTER — Encounter (HOSPITAL_COMMUNITY): Payer: Self-pay | Admitting: Obstetrics & Gynecology

## 2024-10-11 LAB — CBC
HCT: 21 % — ABNORMAL LOW (ref 36.0–46.0)
Hemoglobin: 7.1 g/dL — ABNORMAL LOW (ref 12.0–15.0)
MCH: 29.6 pg (ref 26.0–34.0)
MCHC: 33.8 g/dL (ref 30.0–36.0)
MCV: 87.5 fL (ref 80.0–100.0)
Platelets: 238 K/uL (ref 150–400)
RBC: 2.4 MIL/uL — ABNORMAL LOW (ref 3.87–5.11)
RDW: 12.3 % (ref 11.5–15.5)
WBC: 20.9 K/uL — ABNORMAL HIGH (ref 4.0–10.5)
nRBC: 0.1 % (ref 0.0–0.2)

## 2024-10-11 LAB — SYPHILIS: RPR W/REFLEX TO RPR TITER AND TREPONEMAL ANTIBODIES, TRADITIONAL SCREENING AND DIAGNOSIS ALGORITHM: RPR Ser Ql: NONREACTIVE

## 2024-10-11 MED ORDER — SODIUM CHLORIDE 0.9 % IV SOLN
500.0000 mg | Freq: Once | INTRAVENOUS | Status: AC
  Administered 2024-10-11: 500 mg via INTRAVENOUS
  Filled 2024-10-11: qty 25

## 2024-10-11 MED ORDER — FERROUS SULFATE 325 (65 FE) MG PO TABS
325.0000 mg | ORAL_TABLET | Freq: Every day | ORAL | Status: DC
  Administered 2024-10-11 – 2024-10-13 (×3): 325 mg via ORAL
  Filled 2024-10-11 (×3): qty 1

## 2024-10-11 NOTE — Progress Notes (Signed)
 Subjective: Postpartum Day 1: Cesarean Delivery Patient reports incisional pain, tolerating PO, and no problems voiding.    Objective: Vital signs in last 24 hours: Temp:  [97.8 F (36.6 C)-98.4 F (36.9 C)] 98.4 F (36.9 C) (01/17 0910) Pulse Rate:  [85-103] 101 (01/17 0910) Resp:  [16-20] 16 (01/17 0910) BP: (112-134)/(58-83) 124/76 (01/17 0910) SpO2:  [98 %-100 %] 100 % (01/17 0910)  Physical Exam:  General: alert, cooperative, and no distress Lochia: appropriate Uterine Fundus: firm Incision: no significant drainage DVT Evaluation: No evidence of DVT seen on physical exam.  Recent Labs    10/10/24 0710 10/11/24 0509  HGB 9.2* 7.1*  HCT 27.5* 21.0*    Assessment/Plan: Status post Cesarean section. Doing well postoperatively.  Iron  infusion for anemia.  Lynwood Solomons, MD 10/11/2024, 10:12 AM

## 2024-10-11 NOTE — Lactation Note (Signed)
 This note was copied from a baby's chart.  NICU Lactation Consultation Note  Patient Name: Karen Koch Date: 10/11/2024 Age:20 hours  Reason for consult: Follow-up assessment; Primapara; 1st time breastfeeding; NICU baby; Late-preterm 34-36.6wks; Infant < 6lbs; Other (Comment) (Placental abruption) 49 yr old Mom  SUBJECTIVE  LC met with P1 Mom of 34w infant delivered by C/Section due to placental abruption.  Mom receiving iron  infusion currently due to low hgb of 7.1.  Mom did STS with baby [name] in the NICU today.  Talked about the benefit of breast massage and hand expression while baby STS, reviewed this with Mom, colostrum drop noted.  LC provided a small pumping top and sized her for 18 mm flanges.  Mom encouraged to do hands on pumping to stimulate her milk supply.  Reviewed with Mom the importance of washing all the pump parts, and 2 basins provided and labeled for this.  Mom encouraged to pump every 3 hrs, the best she can, goal of pumping 8 times per 24 hrs shared.  OBJECTIVE Infant data: Mother's Current Feeding Choice: Breast Milk and Donor Milk  O2 Device: Room Air FiO2 (%): 21 %  Infant feeding assessment IDFS - Readiness: 3   Maternal data: G1P0101 C-Section, Low Transverse Has patient been taught Hand Expression?: Yes Hand Expression Comments: no colostrum noted at this time Significant Breast History:: (+++) during the pregnancy Current breast feeding challenges:: NICU admission Does the patient have breastfeeding experience prior to this delivery?: No Pumping frequency: Mom pumping every 3 hrs Pumped volume: 0 mL (drops) Flange Size: 18 Hands-free pumping top sizes: Small/Medium (Blue) Risk factor for low/delayed milk supply:: C/S, primipara, prematurity, < 6 lbs, infant separation  Pump: Received Stork Pump (Spectra , instructed to order smaller flanges, 17 mm)  ASSESSMENT Infant:   Feeding Status: Scheduled 8-11-2-5 Feeding method:  Tube/Gavage (Bolus)  Maternal: Milk volume: Normal  INTERVENTIONS/PLAN Interventions: Interventions: Skin to skin; Breast massage; Hand express; Coconut oil; DEBP; Education Tools: Pump; Flanges; Hands-free pumping top; Coconut oil Pump Education: Setup, frequency, and cleaning; Milk Storage  Plan: Consult Status: NICU follow-up NICU Follow-up type: Maternal D/C visit; Verify onset of copious milk; Verify absence of engorgement   Karen Koch 10/11/2024, 1:32 PM

## 2024-10-11 NOTE — Plan of Care (Signed)
" °  Problem: Education: Goal: Knowledge of General Education information will improve Description: Including pain rating scale, medication(s)/side effects and non-pharmacologic comfort measures Outcome: Progressing   Problem: Health Behavior/Discharge Planning: Goal: Ability to manage health-related needs will improve Outcome: Progressing   Problem: Clinical Measurements: Goal: Ability to maintain clinical measurements within normal limits will improve Outcome: Progressing Goal: Will remain free from infection Outcome: Progressing Goal: Diagnostic test results will improve Outcome: Progressing Goal: Respiratory complications will improve Outcome: Progressing Goal: Cardiovascular complication will be avoided Outcome: Progressing   Problem: Activity: Goal: Risk for activity intolerance will decrease Outcome: Progressing   Problem: Nutrition: Goal: Adequate nutrition will be maintained Outcome: Progressing   Problem: Coping: Goal: Level of anxiety will decrease Outcome: Progressing   Problem: Elimination: Goal: Will not experience complications related to bowel motility Outcome: Progressing Goal: Will not experience complications related to urinary retention Outcome: Progressing   Problem: Pain Managment: Goal: General experience of comfort will improve and/or be controlled Outcome: Progressing   Problem: Safety: Goal: Ability to remain free from injury will improve Outcome: Progressing   Problem: Skin Integrity: Goal: Risk for impaired skin integrity will decrease Outcome: Progressing   Problem: Education: Goal: Knowledge of the prescribed therapeutic regimen will improve Outcome: Progressing   Problem: Bowel/Gastric: Goal: Gastrointestinal status for postoperative course will improve Outcome: Progressing   Problem: Cardiac: Goal: Ability to maintain an adequate cardiac output Outcome: Progressing Goal: Will show no evidence of cardiac arrhythmias Outcome:  Progressing   Problem: Nutritional: Goal: Will attain and maintain optimal nutritional status Outcome: Progressing   Problem: Neurological: Goal: Will regain or maintain usual level of consciousness Outcome: Progressing   Problem: Clinical Measurements: Goal: Ability to maintain clinical measurements within normal limits Outcome: Progressing Goal: Postoperative complications will be avoided or minimized Outcome: Progressing   Problem: Respiratory: Goal: Will regain and/or maintain adequate ventilation Outcome: Progressing Goal: Respiratory status will improve Outcome: Progressing   Problem: Skin Integrity: Goal: Demonstrates signs of wound healing without infection Outcome: Progressing   Problem: Urinary Elimination: Goal: Will remain free from infection Outcome: Progressing Goal: Ability to achieve and maintain adequate urine output Outcome: Progressing   Problem: Education: Goal: Knowledge of the prescribed therapeutic regimen will improve Outcome: Progressing Goal: Understanding of sexual limitations or changes related to disease process or condition will improve Outcome: Progressing Goal: Individualized Educational Video(s) Outcome: Progressing   Problem: Self-Concept: Goal: Communication of feelings regarding changes in body function or appearance will improve Outcome: Progressing   Problem: Skin Integrity: Goal: Demonstration of wound healing without infection will improve Outcome: Progressing   Problem: Education: Goal: Knowledge of condition will improve Outcome: Progressing Goal: Individualized Educational Video(s) Outcome: Progressing Goal: Individualized Newborn Educational Video(s) Outcome: Progressing   Problem: Activity: Goal: Will verbalize the importance of balancing activity with adequate rest periods Outcome: Progressing Goal: Ability to tolerate increased activity will improve Outcome: Progressing   Problem: Coping: Goal: Ability to  identify and utilize available resources and services will improve Outcome: Progressing   Problem: Life Cycle: Goal: Chance of risk for complications during the postpartum period will decrease Outcome: Progressing   Problem: Role Relationship: Goal: Ability to demonstrate positive interaction with newborn will improve Outcome: Progressing   Problem: Skin Integrity: Goal: Demonstration of wound healing without infection will improve Outcome: Progressing   "

## 2024-10-12 LAB — CBC
HCT: 22 % — ABNORMAL LOW (ref 36.0–46.0)
HCT: 28.7 % — ABNORMAL LOW (ref 36.0–46.0)
Hemoglobin: 7.1 g/dL — ABNORMAL LOW (ref 12.0–15.0)
Hemoglobin: 9.5 g/dL — ABNORMAL LOW (ref 12.0–15.0)
MCH: 29 pg (ref 26.0–34.0)
MCH: 28.5 pg (ref 26.0–34.0)
MCHC: 32.3 g/dL (ref 30.0–36.0)
MCHC: 33.1 g/dL (ref 30.0–36.0)
MCV: 89.8 fL (ref 80.0–100.0)
MCV: 86.2 fL (ref 80.0–100.0)
Platelets: 245 K/uL (ref 150–400)
Platelets: 282 K/uL (ref 150–400)
RBC: 2.45 MIL/uL — ABNORMAL LOW (ref 3.87–5.11)
RBC: 3.33 MIL/uL — ABNORMAL LOW (ref 3.87–5.11)
RDW: 12.8 % (ref 11.5–15.5)
RDW: 14.2 % (ref 11.5–15.5)
WBC: 17.2 K/uL — ABNORMAL HIGH (ref 4.0–10.5)
WBC: 14.8 K/uL — ABNORMAL HIGH (ref 4.0–10.5)
nRBC: 0.2 % (ref 0.0–0.2)
nRBC: 0.5 % — ABNORMAL HIGH (ref 0.0–0.2)

## 2024-10-12 LAB — PREPARE RBC (CROSSMATCH)

## 2024-10-12 MED ORDER — SODIUM CHLORIDE 0.9% IV SOLUTION: Freq: Once | INTRAVENOUS | Status: AC

## 2024-10-12 MED ORDER — ONDANSETRON 4 MG PO TBDP
4.0000 mg | ORAL_TABLET | Freq: Four times a day (QID) | ORAL | Status: DC | PRN
  Administered 2024-10-12 (×2): 4 mg via ORAL
  Filled 2024-10-12 (×2): qty 1

## 2024-10-12 NOTE — Progress Notes (Signed)
 POSTPARTUM PROGRESS NOTE  Subjective: 19 y.o. G1P0101 who is POD2 s/p primary cesarean section for placental abruption Course complicated by: acute on chronic anemia  Feeling light headed/dizzy with ambulation. Pain controlled. No significant bleeding. Voiding spontaneously.  Objective: Blood pressure 122/73, pulse 92, temperature 97.9 F (36.6 C), temperature source Oral, resp. rate 18, height 5' 7 (1.702 m), last menstrual period 02/15/2024, SpO2 100%, unknown if currently breastfeeding.  Physical Exam:  General: alert, cooperative, and no distress Abdomen: soft, nontender nondistended Uterine Fundus: firm Lochia: appropriate Incision: clean/dry/intact with dressing in place Lower extremities: no significant lower extremity edema  Recent Labs    10/11/24 0509 10/12/24 0502  HGB 7.1* 7.1*  HCT 21.0* 22.0*    Assessment/Plan: Postpartum: routine care  2. Acute on chronic anemia: Discussed risks/benefits of blood transfusion including improved recovery and overall feeling well, increased organ perfusion, improvement in breastfeeding as well as risks of infection such as HIV/hepatitis or transfusion reaction. She verbalized her understanding. Consent signed - transfuse 1 unit pRBCs  Neonatal: baby in NICU due to preterm status  3. Dispo: inpatient management   LOS: 2 days    Rollo ONEIDA Bring, MD, FACOG Obstetrician & Gynecologist, [facility] for [facility], [facility] 10/12/2024, 7:39 AM

## 2024-10-12 NOTE — Progress Notes (Signed)
 Patient reports feeling winded and shaky with minimal exertion/ performing ADL's. Dr. Eveline made aware at 1531.

## 2024-10-12 NOTE — Lactation Note (Signed)
 This note was copied from a baby's chart.  NICU Lactation Consultation Note  Patient Name: Karen Koch Unijb'd Date: 10/12/2024 Age:20 hours  Reason for consult: Follow-up assessment; Primapara; 1st time breastfeeding; NICU baby; Maternal discharge; Late-preterm 34-36.6wks; Other (Comment); Infant < 6lbs (teen pregnancy)  SUBJECTIVE Visited with family of 35 75/66 weeks old AGA NICU female Karen Koch; Ms. Bilger is a P1 and reported she's pumping, her milk came in today, praised her for all her efforts. Noticed that pumping hasn't been consistent; she voiced some fullness and heaviness on her breasts (see maternal assessment).   She's expecting to be discharged from [facility] tomorrow; she got a transfusion today. She's planning on rooming in with baby after her discharge. Reviewed discharge education, pump settings and the importance of consistent and frequent pumping for the prevention of engorgement and to protect her supply. She's aware she needs to get inserts for her Spectra  pump at home.  OBJECTIVE Infant data: Mother's Current Feeding Choice: Breast Milk and Donor Milk  O2 Device: Room Air  Infant feeding assessment IDFS - Readiness: 3   Maternal data: G1P0101 C-Section, Low Transverse Pumping frequency: 3 times/24 hours Pumped volume: 20 mL Flange Size: 18 Hands-free pumping top sizes: Small/Medium (Blue)  Pump: Received Stork Pump (Spectra , instructed to order smaller flanges, 17 mm)  ASSESSMENT Infant: Feeding Status: Scheduled 9-12-3-6 Feeding method: Tube/Gavage (Bolus) Nipple Type: Lick and learn  Maternal: Milk volume: Normal No S/S of engorgement at this, time breasts are filling in and MOB just needs to pump  INTERVENTIONS/PLAN Interventions: Interventions: Breast feeding basics reviewed; Coconut oil; DEBP; Education Discharge Education: Engorgement and breast care Tools: Pump; Flanges; Hands-free pumping top; Coconut oil Pump Education: Setup, frequency,  and cleaning; Milk Storage  Plan: STS around care times Pump both breasts on maintain mode every 3 hours for 20-30 minutes; ideally 8 pumping sessions/24 hours Ice her breasts PRN if they still have knots after 30 minutes of pumping Bring all pump pieces to baby's room after her discharge   MGM present and supportive. All questions and concerns answered, family to contact [facility] services PRN.   Consult Status: NICU follow-up NICU Follow-up type: Verify onset of copious milk; Verify absence of engorgement   Lucero Ide S Lakasha Mcfall 10/12/2024, 3:34 PM

## 2024-10-12 NOTE — H&P (Signed)
 Obstetric Preoperative History and Physical  Note created post delivery- due to emergent delivery  Karen Koch is a 19 y.o. G1P0101 with IUP at [redacted]w[redacted]d presented via EMS due to heavy vaginal bleeding and severe abdominal pain.  Pt was unable to answer most questions due to significant pain.  Prenatal Course Source of Care: [facility]  with onset of care at 12 weeks Pregnancy complications or risks: Patient Active Problem List   Diagnosis Date Noted   Placental abruption 10/10/2024   High risk teen pregnancy 06/25/2024   UTI due to Klebsiella species 05/12/2024   Excessive salivation while pregnant 05/12/2024   Nausea and vomiting during pregnancy 05/12/2024   Positive depression screening 05/12/2024   Supervision of other normal pregnancy, antepartum 04/14/2024    Prenatal labs and studies: ABO, Rh: --/--/B POS (01/16 0520) Antibody: NEG (01/16 0520) Rubella: 15.80 (08/21 0945) RPR: NON REACTIVE (01/16 1143)  HBsAg: Negative (08/21 0945)  HIV: Non Reactive (12/08 0857)  GBS:  1 hr Glucola  normal Genetic screening normal Anatomy US  normal  Prenatal Transfer Tool  Maternal Diabetes: No Genetic Screening: Normal Maternal Ultrasounds/Referrals: Normal Fetal Ultrasounds or other Referrals:  None Maternal Substance Abuse:  No Significant Maternal Medications:  None Significant Maternal Lab Results: None  Past Medical History:  Diagnosis Date   Dysmenorrhea in adolescent 12/07/2020   Medical history non-contributory     Past Surgical History:  Procedure Laterality Date   CESAREAN SECTION N/A 10/10/2024   Procedure: CESAREAN DELIVERY;  Surgeon: Cordarious Zeek, DO;  Location: [facility] LD [facility]S;  Service: Obstetrics;  Laterality: N/A;   WISDOM TOOTH EXTRACTION      OB History  Gravida Para Term Preterm AB Living  1 1 0 1 0 1  SAB IAB Ectopic Multiple Live Births  0 0 0 0 1    # Outcome Date GA Lbr Len/2nd Weight Sex Type Anes PTL Lv  1 Preterm 10/10/24 [redacted]w[redacted]d  2510 g M CS-LTranv  Gen  LIV    Social History   Socioeconomic History   Marital status: Single    Spouse name: Not on file   Number of children: Not on file   Years of education: Not on file   Highest education level: Not on file  Occupational History   Not on file  Tobacco Use   Smoking status: Never   Smokeless tobacco: Never  Vaping Use   Vaping status: Never Used  Substance and Sexual Activity   Alcohol use: Never   Drug use: Never   Sexual activity: Yes    Birth control/protection: None  Other Topics Concern   Not on file  Social History Narrative   Not on file   Social Drivers of Health   Tobacco Use: Low Risk (10/11/2024)   Patient History    Smoking Tobacco Use: Never    Smokeless Tobacco Use: Never    Passive Exposure: Not on file  Financial Resource Strain: Not on file  Food Insecurity: No Food Insecurity (10/10/2024)   Epic    Worried About Programme Researcher, Broadcasting/film/video in the Last Year: Never true    Ran Out of Food in the Last Year: Never true  Transportation Needs: No Transportation Needs (10/10/2024)   Epic    Lack of Transportation (Medical): No    Lack of Transportation (Non-Medical): No  Physical Activity: Not on file  Stress: Not on file  Social Connections: Not on file  Depression (PHQ2-9): Low Risk (09/01/2024)   Depression (PHQ2-9)    PHQ-2  Score: 4  Alcohol Screen: Not on file  Housing: Low Risk (10/10/2024)   Epic    Unable to Pay for Housing in the Last Year: No    Number of Times Moved in the Last Year: 0    Homeless in the Last Year: No  Utilities: Not At Risk (10/10/2024)   Epic    Threatened with loss of utilities: No  Health Literacy: Not on file    Family History  Problem Relation Age of Onset   Seizures Mother    AAA (abdominal aortic aneurysm) Father    Diabetes Paternal Grandmother    Anemia Paternal Grandfather     Medications Prior to Admission  Medication Sig Dispense Refill Last Dose/Taking   aspirin  EC 81 MG tablet Take 1 tablet (81 mg  total) by mouth daily. Swallow whole. (Patient not taking: Reported on 09/30/2024) 30 tablet 6    glycopyrrolate  (ROBINUL ) 1 MG tablet Take 1 tablet (1 mg total) by mouth 3 (three) times daily. 90 tablet 3    ondansetron  (ZOFRAN -ODT) 4 MG disintegrating tablet Take 1 tablet (4 mg total) by mouth every 6 (six) hours as needed for nausea. 30 tablet 2    oseltamivir  (TAMIFLU ) 75 MG capsule Take 1 capsule (75 mg total) by mouth every 12 (twelve) hours. (Patient not taking: Reported on 09/30/2024) 10 capsule 0    Prenatal 28-0.8 MG TABS Take 1 tablet by mouth daily. 30 tablet 12     Allergies[1]  Review of Systems: Pertinent items noted in HPI and remainder of comprehensive ROS otherwise negative.  Physical Exam: Exam limited due to acute nature of care FHR by Doppler: 70bpm  bpm CONSTITUTIONAL: acute distress SKIN: Skin is warm and dry.  RESPIRATORY: Effort and breath sounds normal, no problems with respiration noted ABDOMEN: Gravid, diffuse tenderness PELVIC: Deferred  Pertinent Labs/Studies:   Results for orders placed or performed during the hospital encounter of 10/10/24 (from the past 72 hours)  Type and screen [facility]     Status: None (Preliminary result)   Collection Time: 10/10/24  5:20 AM  Result Value Ref Range   ABO/RH(D) B POS    Antibody Screen NEG    Sample Expiration 10/13/2024,2359    Unit Number [id number]    Blood Component Type RBC LR PHER1    Unit division 00    Status of Unit ISSUED    Transfusion Status OK TO TRANSFUSE    Crossmatch Result      Compatible Performed at [facility], [address] [place], [place] [address]   CBC     Status: Abnormal   Collection Time: 10/10/24  7:10 AM  Result Value Ref Range   WBC 15.6 (H) 4.0 - 10.5 K/uL   RBC 3.11 (L) 3.87 - 5.11 MIL/uL   Hemoglobin 9.2 (L) 12.0 - 15.0 g/dL   HCT 72.4 (L) 63.9 - 53.9 %   MCV 88.4 80.0 - 100.0 fL   MCH 29.6 26.0 - 34.0 pg   MCHC 33.5 30.0 - 36.0 g/dL   RDW  87.6 88.4 - 84.4 %   Platelets 295 150 - 400 K/uL   nRBC 0.0 0.0 - 0.2 %    Comment: Performed at [facility], [address], [place], [place] [address]  Comprehensive metabolic panel     Status: Abnormal   Collection Time: 10/10/24  7:10 AM  Result Value Ref Range   Sodium 133 (L) 135 - 145 mmol/L   Potassium  3.3 (L) 3.5 - 5.1 mmol/L   Chloride 102 98 - 111 mmol/L   CO2 19 (L) 22 - 32 mmol/L   Glucose, Bld 107 (H) 70 - 99 mg/dL    Comment: Glucose reference range applies only to samples taken after fasting for at least 8 hours.   BUN 7 6 - 20 mg/dL   Creatinine, Ser 9.55 0.44 - 1.00 mg/dL   Calcium 8.6 (L) 8.9 - 10.3 mg/dL   Total Protein 6.1 (L) 6.5 - 8.1 g/dL   Albumin 3.2 (L) 3.5 - 5.0 g/dL   AST 30 15 - 41 U/L   ALT 28 0 - 44 U/L   Alkaline Phosphatase 135 (H) 38 - 126 U/L   Total Bilirubin 0.3 0.0 - 1.2 mg/dL   GFR, Estimated >39 >39 mL/min    Comment: (NOTE) Calculated using the CKD-EPI Creatinine Equation (2021)    Anion gap 13 5 - 15    Comment: Performed at [facility], [address] [place], [place] [address]  RPR     Status: None   Collection Time: 10/10/24 11:43 AM  Result Value Ref Range   RPR Ser Ql NON REACTIVE NON REACTIVE    Comment: Performed at [facility], [address] [place], [place] [address]  CBC     Status: Abnormal   Collection Time: 10/11/24  5:09 AM  Result Value Ref Range   WBC 20.9 (H) 4.0 - 10.5 K/uL   RBC 2.40 (L) 3.87 - 5.11 MIL/uL   Hemoglobin 7.1 (L) 12.0 - 15.0 g/dL   HCT 78.9 (L) 63.9 - 53.9 %   MCV 87.5 80.0 - 100.0 fL   MCH 29.6 26.0 - 34.0 pg   MCHC 33.8 30.0 - 36.0 g/dL   RDW 87.6 88.4 - 84.4 %   Platelets 238 150 - 400 K/uL   nRBC 0.1 0.0 - 0.2 %    Comment: Performed at [facility], [address], [place], [place] [address]  CBC     Status: Abnormal   Collection Time: 10/12/24  5:02 AM  Result Value Ref Range   WBC 17.2 (H) 4.0 - 10.5 K/uL   RBC 2.45 (L) 3.87 - 5.11 MIL/uL   Hemoglobin  7.1 (L) 12.0 - 15.0 g/dL   HCT 77.9 (L) 63.9 - 53.9 %   MCV 89.8 80.0 - 100.0 fL   MCH 29.0 26.0 - 34.0 pg   MCHC 32.3 30.0 - 36.0 g/dL   RDW 87.1 88.4 - 84.4 %   Platelets 245 150 - 400 K/uL   nRBC 0.2 0.0 - 0.2 %    Comment: Performed at [facility], [address] [place], [place] [address]  Prepare RBC (crossmatch)     Status: None   Collection Time: 10/12/24  7:35 AM  Result Value Ref Range   Order Confirmation      ORDER PROCESSED BY BLOOD BANK Performed at [facility], [address] [place], [place] [address]     Assessment and Plan: Aashna Matson is a 19 y.o. G1P0101 at [redacted]w[redacted]d who went for emergent C_section due to placenta abruption OR notified -see prior note  Jenny Tawna Alwin, DO Obstetrician & Gynecologist, [employer] for [employer], [facility]    [1] No Known Allergies

## 2024-10-12 NOTE — Plan of Care (Signed)
" °  Problem: Education: Goal: Knowledge of General Education information will improve Description: Including pain rating scale, medication(s)/side effects and non-pharmacologic comfort measures Outcome: Progressing   Problem: Health Behavior/Discharge Planning: Goal: Ability to manage health-related needs will improve Outcome: Progressing   Problem: Clinical Measurements: Goal: Ability to maintain clinical measurements within normal limits will improve Outcome: Progressing Goal: Will remain free from infection Outcome: Progressing Goal: Diagnostic test results will improve Outcome: Progressing Goal: Respiratory complications will improve Outcome: Progressing Goal: Cardiovascular complication will be avoided Outcome: Progressing   Problem: Activity: Goal: Risk for activity intolerance will decrease Outcome: Progressing   Problem: Nutrition: Goal: Adequate nutrition will be maintained Outcome: Progressing   Problem: Coping: Goal: Level of anxiety will decrease Outcome: Progressing   Problem: Elimination: Goal: Will not experience complications related to bowel motility Outcome: Progressing Goal: Will not experience complications related to urinary retention Outcome: Progressing   Problem: Pain Managment: Goal: General experience of comfort will improve and/or be controlled Outcome: Progressing   Problem: Safety: Goal: Ability to remain free from injury will improve Outcome: Progressing   Problem: Skin Integrity: Goal: Risk for impaired skin integrity will decrease Outcome: Progressing   Problem: Education: Goal: Knowledge of the prescribed therapeutic regimen will improve Outcome: Progressing   Problem: Bowel/Gastric: Goal: Gastrointestinal status for postoperative course will improve Outcome: Progressing   Problem: Cardiac: Goal: Ability to maintain an adequate cardiac output Outcome: Progressing Goal: Will show no evidence of cardiac arrhythmias Outcome:  Progressing   Problem: Nutritional: Goal: Will attain and maintain optimal nutritional status Outcome: Progressing   Problem: Neurological: Goal: Will regain or maintain usual level of consciousness Outcome: Progressing   Problem: Clinical Measurements: Goal: Ability to maintain clinical measurements within normal limits Outcome: Progressing Goal: Postoperative complications will be avoided or minimized Outcome: Progressing   Problem: Respiratory: Goal: Will regain and/or maintain adequate ventilation Outcome: Progressing Goal: Respiratory status will improve Outcome: Progressing   Problem: Skin Integrity: Goal: Demonstrates signs of wound healing without infection Outcome: Progressing   Problem: Urinary Elimination: Goal: Will remain free from infection Outcome: Progressing Goal: Ability to achieve and maintain adequate urine output Outcome: Progressing   Problem: Education: Goal: Knowledge of the prescribed therapeutic regimen will improve Outcome: Progressing Goal: Understanding of sexual limitations or changes related to disease process or condition will improve Outcome: Progressing Goal: Individualized Educational Video(s) Outcome: Progressing   Problem: Self-Concept: Goal: Communication of feelings regarding changes in body function or appearance will improve Outcome: Progressing   Problem: Skin Integrity: Goal: Demonstration of wound healing without infection will improve Outcome: Progressing   Problem: Education: Goal: Knowledge of condition will improve Outcome: Progressing Goal: Individualized Educational Video(s) Outcome: Progressing Goal: Individualized Newborn Educational Video(s) Outcome: Progressing   Problem: Activity: Goal: Will verbalize the importance of balancing activity with adequate rest periods Outcome: Progressing Goal: Ability to tolerate increased activity will improve Outcome: Progressing   Problem: Coping: Goal: Ability to  identify and utilize available resources and services will improve Outcome: Progressing   Problem: Life Cycle: Goal: Chance of risk for complications during the postpartum period will decrease Outcome: Progressing   Problem: Role Relationship: Goal: Ability to demonstrate positive interaction with newborn will improve Outcome: Progressing   Problem: Skin Integrity: Goal: Demonstration of wound healing without infection will improve Outcome: Progressing   "

## 2024-10-13 ENCOUNTER — Other Ambulatory Visit (HOSPITAL_COMMUNITY): Payer: Self-pay

## 2024-10-13 LAB — CBC
HCT: 27.7 % — ABNORMAL LOW (ref 36.0–46.0)
Hemoglobin: 9.1 g/dL — ABNORMAL LOW (ref 12.0–15.0)
MCH: 28.1 pg (ref 26.0–34.0)
MCHC: 32.9 g/dL (ref 30.0–36.0)
MCV: 85.5 fL (ref 80.0–100.0)
Platelets: 292 K/uL (ref 150–400)
RBC: 3.24 MIL/uL — ABNORMAL LOW (ref 3.87–5.11)
RDW: 14.6 % (ref 11.5–15.5)
WBC: 13.6 K/uL — ABNORMAL HIGH (ref 4.0–10.5)
nRBC: 0.6 % — ABNORMAL HIGH (ref 0.0–0.2)

## 2024-10-13 LAB — TYPE AND SCREEN
ABO/RH(D): B POS
Antibody Screen: NEGATIVE
Unit division: 0
Unit division: 0

## 2024-10-13 LAB — BPAM RBC
Blood Product Expiration Date: 202602072359
Blood Product Expiration Date: 202602072359
ISSUE DATE / TIME: 202601180843
ISSUE DATE / TIME: 202601181552
Unit Type and Rh: 7300
Unit Type and Rh: 7300

## 2024-10-13 LAB — SURGICAL PATHOLOGY

## 2024-10-13 MED ORDER — IBUPROFEN 600 MG PO TABS
600.0000 mg | ORAL_TABLET | Freq: Four times a day (QID) | ORAL | 0 refills | Status: AC
  Filled 2024-10-13: qty 30, 8d supply, fill #0

## 2024-10-13 MED ORDER — FERROUS SULFATE 325 (65 FE) MG PO TABS
325.0000 mg | ORAL_TABLET | ORAL | 0 refills | Status: AC
  Filled 2024-10-13: qty 30, 60d supply, fill #0

## 2024-10-13 MED ORDER — OXYCODONE-ACETAMINOPHEN 5-325 MG PO TABS
1.0000 | ORAL_TABLET | Freq: Four times a day (QID) | ORAL | 0 refills | Status: AC | PRN
  Filled 2024-10-13: qty 20, 5d supply, fill #0

## 2024-10-13 NOTE — Plan of Care (Signed)
" °  Problem: Education: Goal: Knowledge of General Education information will improve Description: Including pain rating scale, medication(s)/side effects and non-pharmacologic comfort measures Outcome: Completed/Met   Problem: Health Behavior/Discharge Planning: Goal: Ability to manage health-related needs will improve Outcome: Completed/Met   Problem: Clinical Measurements: Goal: Ability to maintain clinical measurements within normal limits will improve Outcome: Completed/Met Goal: Will remain free from infection Outcome: Completed/Met Goal: Diagnostic test results will improve Outcome: Completed/Met Goal: Respiratory complications will improve Outcome: Completed/Met Goal: Cardiovascular complication will be avoided Outcome: Completed/Met   Problem: Activity: Goal: Risk for activity intolerance will decrease Outcome: Completed/Met   Problem: Nutrition: Goal: Adequate nutrition will be maintained Outcome: Completed/Met   Problem: Coping: Goal: Level of anxiety will decrease Outcome: Completed/Met   Problem: Elimination: Goal: Will not experience complications related to bowel motility Outcome: Completed/Met Goal: Will not experience complications related to urinary retention Outcome: Completed/Met   Problem: Pain Managment: Goal: General experience of comfort will improve and/or be controlled Outcome: Completed/Met   Problem: Safety: Goal: Ability to remain free from injury will improve Outcome: Completed/Met   Problem: Skin Integrity: Goal: Risk for impaired skin integrity will decrease Outcome: Completed/Met   Problem: Education: Goal: Knowledge of the prescribed therapeutic regimen will improve Outcome: Completed/Met   Problem: Bowel/Gastric: Goal: Gastrointestinal status for postoperative course will improve Outcome: Completed/Met   Problem: Cardiac: Goal: Ability to maintain an adequate cardiac output Outcome: Completed/Met Goal: Will show no  evidence of cardiac arrhythmias Outcome: Completed/Met   Problem: Nutritional: Goal: Will attain and maintain optimal nutritional status Outcome: Completed/Met   Problem: Neurological: Goal: Will regain or maintain usual level of consciousness Outcome: Completed/Met   Problem: Clinical Measurements: Goal: Ability to maintain clinical measurements within normal limits Outcome: Completed/Met Goal: Postoperative complications will be avoided or minimized Outcome: Completed/Met   Problem: Respiratory: Goal: Will regain and/or maintain adequate ventilation Outcome: Completed/Met Goal: Respiratory status will improve Outcome: Completed/Met   Problem: Skin Integrity: Goal: Demonstrates signs of wound healing without infection Outcome: Completed/Met   Problem: Urinary Elimination: Goal: Will remain free from infection Outcome: Completed/Met Goal: Ability to achieve and maintain adequate urine output Outcome: Completed/Met   Problem: Education: Goal: Knowledge of the prescribed therapeutic regimen will improve Outcome: Completed/Met Goal: Understanding of sexual limitations or changes related to disease process or condition will improve Outcome: Completed/Met Goal: Individualized Educational Video(s) Outcome: Completed/Met   Problem: Self-Concept: Goal: Communication of feelings regarding changes in body function or appearance will improve Outcome: Completed/Met   Problem: Skin Integrity: Goal: Demonstration of wound healing without infection will improve Outcome: Completed/Met   Problem: Education: Goal: Knowledge of condition will improve Outcome: Completed/Met Goal: Individualized Educational Video(s) Outcome: Completed/Met Goal: Individualized Newborn Educational Video(s) Outcome: Completed/Met   Problem: Activity: Goal: Will verbalize the importance of balancing activity with adequate rest periods Outcome: Completed/Met Goal: Ability to tolerate increased  activity will improve Outcome: Completed/Met   Problem: Coping: Goal: Ability to identify and utilize available resources and services will improve Outcome: Completed/Met   Problem: Life Cycle: Goal: Chance of risk for complications during the postpartum period will decrease Outcome: Completed/Met   Problem: Role Relationship: Goal: Ability to demonstrate positive interaction with newborn will improve Outcome: Completed/Met   Problem: Skin Integrity: Goal: Demonstration of wound healing without infection will improve Outcome: Completed/Met   "

## 2024-10-13 NOTE — Progress Notes (Signed)

## 2024-10-15 ENCOUNTER — Encounter: Payer: Self-pay | Admitting: Obstetrics and Gynecology

## 2024-10-16 ENCOUNTER — Ambulatory Visit: Payer: Self-pay

## 2024-10-16 VITALS — BP 106/72 | HR 82 | Wt 145.6 lb

## 2024-10-16 DIAGNOSIS — Z4889 Encounter for other specified surgical aftercare: Secondary | ICD-10-CM

## 2024-10-16 NOTE — Progress Notes (Signed)
.. °  Subjective:     Karen Koch is a 19 y.o. female who presents to the clinic 1 weeks status post c-section. Eating a regular diet without difficulty. Bowel movements are normal. Pain is controlled with current analgesics. Medications being used: acetaminophen , ibuprofen  (OTC), and narcotic analgesics including oxycodone /acetaminophen  (Percocet, Tylox).  Review of Systems Pertinent items are noted in HPI.    Objective:    BP 106/72   Pulse 82   Wt 145 lb 9.6 oz (66 kg)   LMP 02/15/2024 (Exact Date)   BMI 22.80 kg/m  General:  alert and cooperative  Abdomen: soft, bowel sounds active, non-tender  Incision:   healing well, no drainage, no erythema, no hernia, no seroma, no swelling, no dehiscence, incision well approximated     Assessment:    Doing well postoperatively.   Plan:    1. Continue any current medications. 2. Wound care discussed. 3. Activity restrictions: no lifting more than 10 pounds and no standing longer than 1 hour without a break 4. Anticipated return to work: TBD. 5. Follow up: 4 weeks for postpartum visit.   Karen [name], RN

## 2024-10-18 NOTE — Lactation Note (Signed)
 This note was copied from a baby's chart.  NICU Lactation Consultation Note  Patient Name: Karen Koch Unijb'd Date: 10/18/2024 Age:20 days  Reason for consult: Late-preterm 34-36.6wks; Weekly NICU follow-up; 1st time breastfeeding; Infant < 6lbs; Primapara; NICU baby; Other (Comment) (teen pregnancy)  SUBJECTIVE Baby, Karen Koch is 35wks1d AGA. MOB, FOB and Baby were present. MOB was finishing up a pumping session upon arrival. MOB expressed how she pumps every 4 hours which is 6x/day. MOB receives about 5.5 oz on the right breast and 4 oz on the left breast during her sessions. MOB and LC Mili felt comfortable with pumping every 4 hours due to robust milk supply. We spoke with MOB about education on pumping tools, flange inserts, and importance of consisently emptying breast to prevent engorgement. Mob will order flange inserts once she gets the chance.  OBJECTIVE Infant data: Mother's Current Feeding Choice: Breast Milk  O2 Device: Room Air  Infant feeding assessment IDFS - Readiness: 2 IDFS - Quality: 4   Maternal data: G1P0101 C-Section, Low Transverse Pumping frequency: 6 times in 24/hr Pumped volume: 270 mL (9-9.5 oz per pumping session)  Pump: Received Stork Pump (Spectra , instructed to order smaller flanges, 17 mm)  ASSESSMENT Infant: Feeding Status: Scheduled 9-12-3-6 Feeding method: Breast; Tube/Gavage (Bolus) Nipple Type: Lick and learn  Maternal: Milk volume: Abundant  INTERVENTIONS/PLAN Interventions: Interventions: Breast feeding basics reviewed; Coconut oil; DEBP; Education  Plan: STS around care times Pump both breasts on maintain mode every 3-4 hours for 20-30 minutes; ideally 6-8 pumping sessions/24 hours Ice her breasts PRN if they still have knots after 30 minutes of pumping    FOB present and supportive. All questions and concerns answered, family to contact [facility] services PRN.    Consult Status: NICU follow-up NICU Follow-up type: Weekly  NICU follow up   Granvil Djordjevic 10/18/2024, 5:01 PM

## 2024-10-21 ENCOUNTER — Encounter: Payer: Self-pay | Admitting: Advanced Practice Midwife

## 2024-10-27 ENCOUNTER — Telehealth: Payer: Self-pay | Admitting: *Deleted

## 2024-10-27 NOTE — Telephone Encounter (Signed)
 RTC to pt to follow up on c/o sore spot in breast that is hot to touch. Pt reports localized painful knot in left breast that is hot to touch. She has tried to continue breastfeeding her infant on that side with only temporary relief. Advised  pt to seek care in MAU for further evaluation and to rule out infection. Pt is currently in NICU with infant and will go to MAU following current feeding.

## 2024-10-29 NOTE — Lactation Note (Signed)
 This note was copied from a baby's chart.  NICU Lactation Consultation Note  Patient Name: Karen Koch Unijb'd Date: 10/29/2024 Age:20 wk.o.  Reason for consult: Follow-up assessment; RN request; NICU baby; 1st time breastfeeding; Exclusive pumping and bottle feeding; Late-preterm 34-36.6wks; Other (Comment) (breast pain, tender areas in her left breast)  SUBJECTIVE  Mom concerned about her left breast.  She has an area at about 10 am on her areola that is tender to the touch and slightly reddened.  Mom states she feels 2 areas in the inner upper quadrant. Nipples are without an visible trauma.  Mom has been pumping every 2 hrs and doing deep tissue massage which she states has made it worse.  Mom denies flu-like symptoms or fever or headache.  LC recommended slowing down her frequent pumping to every 3 hrs, 4 hrs at night, pumping for 20 mins.  LC discouraged breast massage.  LC encouraged icing prior to pumping for 10-20 mins. LC encouraged rest and hydration and ibuprofen  for pain.  LC recommended reaching out to her OB if she develops a fever and flu-like symptoms also.  Mom aware of lactation support available to her and encouraged to ask for LC prn.    OBJECTIVE Infant data: No data recorded O2 Device: Room Air  Infant feeding assessment IDFS - Readiness: 2 IDFS - Quality: 3   Maternal data: G1P0101 C-Section, Low Transverse Pumping frequency: 8-10 times per 24 hrs, encouraged Mom to pump 6-8 times per 24 hrs and only for 15-20 mins Pumped volume: 300 mL Flange Size: 18 Hands-free pumping top sizes: Small/Medium (Blue)  Pump: Received Stork Pump (Spectra , instructed to order smaller flanges, 17 mm)  ASSESSMENT Infant:  Feeding Status: Scheduled 9-12-3-6 Feeding method: Bottle; Tube/Gavage (Bolus) Nipple Type: Dr. Jonna Fling Preemie  Maternal: Milk volume: Abundant  INTERVENTIONS/PLAN Interventions: Interventions: Breast feeding basics reviewed; Skin to  skin; DEBP; Ice; Education; 8 oz. bottles Discharge Education: Engorgement and breast care Tools: Pump; Flanges; Hands-free pumping top; Coconut oil Pump Education: Setup, frequency, and cleaning; Milk Storage  Plan: Consult Status: NICU follow-up NICU Follow-up type: Verify absence of engorgement; Weekly NICU follow up   Karen Koch 10/29/2024, 6:22 PM

## 2024-10-30 ENCOUNTER — Inpatient Hospital Stay (HOSPITAL_COMMUNITY)
Admission: AD | Admit: 2024-10-30 | Discharge: 2024-10-30 | Disposition: A | Discharge status: Home / Self Care | Source: Home / Self Care | Attending: Obstetrics & Gynecology | Admitting: Obstetrics & Gynecology

## 2024-10-30 DIAGNOSIS — O9270 Unspecified disorders of lactation: Secondary | ICD-10-CM

## 2024-10-30 NOTE — MAU Provider Note (Signed)
 " History     CSN: [id number]  Arrival date and time: 10/30/24 0803   None     Chief Complaint  Patient presents with   Breast Pain   HPI Patient presenting to the MAU for concern for mastitis.  Postpartum from C-section.  Has been having problems with her left breast since delivery.  Had a clogged duct and went to lactation.  They gave her instructions on how to massage while pumping.  Last night she reports she felt very warm and it was also tender and she noticed more swelling around the breast.  She is concerned about mastitis open to be evaluated.   OB History     Gravida  1   Para  1   Term  0   Preterm  1   AB  0   Living  1      SAB  0   IAB  0   Ectopic  0   Multiple  0   Live Births  1           Past Medical History:  Diagnosis Date   Dysmenorrhea in adolescent 12/07/2020   Medical history non-contributory     Past Surgical History:  Procedure Laterality Date   CESAREAN SECTION N/A 10/10/2024   Procedure: CESAREAN DELIVERY;  Surgeon: Marilynn Nest, DO;  Location: [facility] LD ORS;  Service: Obstetrics;  Laterality: N/A;   WISDOM TOOTH EXTRACTION      Family History  Problem Relation Age of Onset   Seizures Mother    AAA (abdominal aortic aneurysm) Father    Diabetes Paternal Grandmother    Anemia Paternal Grandfather     Social History[1]  Allergies: Allergies[2]  Medications Prior to Admission  Medication Sig Dispense Refill Last Dose/Taking   ferrous sulfate  325 (65 FE) MG tablet Take 1 tablet (325 mg total) by mouth every other day. 30 tablet 0    ibuprofen  (ADVIL ) 600 MG tablet Take 1 tablet (600 mg total) by mouth every 6 (six) hours. 30 tablet 0    oxyCODONE -acetaminophen  (PERCOCET/ROXICET) 5-325 MG tablet Take 1 tablet by mouth every 6 (six) hours as needed. 20 tablet 0    Prenatal 28-0.8 MG TABS Take 1 tablet by mouth daily. 30 tablet 12     Review of Systems  Constitutional:  Positive for chills and diaphoresis.  All other  systems reviewed and are negative.  Physical Exam   Blood pressure 117/72, pulse 86, temperature 97.9 F (36.6 C), resp. rate 18, unknown if currently breastfeeding.  Physical Exam Vitals and nursing note reviewed.  Constitutional:      Appearance: Normal appearance.  HENT:     Head: Normocephalic and atraumatic.     Nose: No congestion or rhinorrhea.  Eyes:     Extraocular Movements: Extraocular movements intact.  Cardiovascular:     Rate and Rhythm: Normal rate.  Pulmonary:     Effort: Pulmonary effort is normal.  Abdominal:     Palpations: Abdomen is soft.     Tenderness: There is no abdominal tenderness.  Musculoskeletal:        General: Normal range of motion.     Cervical back: Normal range of motion.  Skin:    General: Skin is warm.     Capillary Refill: Capillary refill takes less than 2 seconds.  Neurological:     General: No focal deficit present.     Mental Status: She is alert.     Cranial Nerves: No cranial nerve deficit.  Psychiatric:        Mood and Affect: Mood normal.        Behavior: Behavior normal.   Breast exam performed by lactation team.  Reported swollen ducts consistent with obstructed lactation duct.  MAU Course  Procedures  MDM Physical exam Lactation evaluation   Assessment and Plan  Karen Koch is a 19 year old who is postpartum from C-section having breast concerns.  Clogged milk duct Signs and symptoms consistent with a clogged milk duct.  Consulted lactation who came and evaluated the breast and agree likely clogged milk duct.  Low concern for mastitis at this time giving no erythema and no fever.  Lactation team discussed with patient ways to help with the clogged milk duct and will follow-up with her in the outpatient setting.  Discussed return precautions.  No further questions or concerns.  Patient discharged home.  [name] V Ovie Eastep 10/30/2024, 9:26 AM     [1]  Social History Tobacco Use   Smoking status: Never    Smokeless tobacco: Never  Vaping Use   Vaping status: Never Used  Substance Use Topics   Alcohol use: Never   Drug use: Never  [2] No Known Allergies  "

## 2024-10-30 NOTE — MAU Note (Signed)
 Karen Koch is a 19 y.o. at Unknown here in MAU reporting: PP 10/10/2024.c-section. Has been having problems with right breast. Had a clogged duct. Went to lactation and they had her feed on that side and massage while pumping. Last night she felt very hot and the breast was very tender and red. Thatough she might have a fever. Today she feels alittle better but breast is still tender and larger than her left one.   LMP:  Onset of complaint: last night Pain score: 9 Vitals:   10/30/24 0834  BP: 117/72  Pulse: 86  Resp: 18  Temp: 97.9 F (36.6 C)     FHT: n/a   Lab orders placed from triage:

## 2024-10-30 NOTE — Discharge Instructions (Signed)
 It was a pleasure taking care of you today.  I am sorry you are having these issues but it sounds like you have a blocked milk duct.  I am glad that our lactation team was able to see you and please be sure to follow-up with them!  I know that they discussed ways to manage the blocked milk and they are going to send you a MyChart message with information and their contact information.  If you have any issues please return.  Hope you have a wonderful rest of your day!

## 2024-10-30 NOTE — Consult Note (Signed)
" ° °  Outpatient Lactation Note  Visited parent per MD request, Mom stated she had a red spot on her breast, followed instruction given by IN-patient lactation to massage breast towards nipple and ice, stated symptoms got worse and has more lumps un the affected area, stated left breast is now bigger then right breast and has pain.  LC examined breast red wedge on inner upper quadrant of left breast, tender to touch. LC reviewed engorgement and clogged duct.management, lympathic derange, soft strokes up and away from the nipple. OP LC congratulated on birth, reviewed OP lactation support options and details, such as support group, phone calls, virtual/telehealth, and in person lactation consultations. Encouraged to please call anytime with questions or concerns.   Karen Koch, [facility] for [facility] "

## 2024-10-31 NOTE — Lactation Note (Signed)
 This note was copied from a baby's chart.  NICU Lactation Consultation Note  Patient Name: Boy Sophy Mesler Unijb'd Date: 10/31/2024 Age:20 wk.o.  Reason for consult: Follow-up assessment; 1st time breastfeeding; Primapara; NICU baby; Early term 38-38.6wks; Other (Comment) (left breast pain and swollen ducts)  SUBJECTIVE  LC in to visit with P1 Mom of baby [name] in the NICU.   Mom states that she had been to [facility]U due to feeling feverish yesterday.  No fever detected and no engorgement.  OP LC came to consult with Mom and reinforced the need for rest, pumping but not every 2 hrs, more like every 3-4 hrs and reverse pressure massage.  No deep massage and no increased pumping in time or frequency.    LC printed out the Lactation Ed PDFon treatment of plugged ducts and reviewed this with Mom.  She will be trying lecithin caps, dose included on printout.    LC reviewed Sx of mastitis and Mom denies any currently.  Breasts are not engorged and swelling appears less hard to the touch from 2 days ago.  Mom states baby will latch, but tires quickly.    OBJECTIVE Infant data: No data recorded O2 Device: Room Air  Infant feeding assessment IDFS - Readiness: 2 IDFS - Quality: 3   Maternal data: [id number] C-Section, Low Transverse Pumping frequency: 8 times per 24 hrs Pumped volume: 300 mL Flange Size: 18 Hands-free pumping top sizes: Small/Medium (Blue)  Pump: Received Stork Pump (Spectra , instructed to order smaller flanges, 17 mm)  Feeding Status: Scheduled 9-12-3-6 Feeding method: Bottle; Tube/Gavage (Bolus) Nipple Type: Dr. Jonna Fling Preemie  Maternal: Milk volume: Abundant  INTERVENTIONS/PLAN Interventions: Interventions: Skin to skin; Breast massage; Hand express; Education Tools: Pump; Flanges; Hands-free pumping top  Plan: Consult Status: NICU follow-up NICU Follow-up type: Weekly NICU follow up   [name] 10/31/2024, 6:21 PM

## 2024-11-03 ENCOUNTER — Ambulatory Visit: Payer: Self-pay | Admitting: Obstetrics and Gynecology

## 2024-11-18 ENCOUNTER — Ambulatory Visit: Payer: Self-pay | Admitting: Obstetrics and Gynecology
